# Patient Record
Sex: Female | Born: 1975 | Race: Black or African American | Hispanic: No | Marital: Single | Smoking: Never smoker
Health system: Southern US, Community
[De-identification: ages and names within clinical notes are randomized; demographics above are authoritative.]

## PROBLEM LIST (undated history)

## (undated) DIAGNOSIS — D649 Anemia, unspecified: Secondary | ICD-10-CM

## (undated) DIAGNOSIS — B009 Herpesviral infection, unspecified: Secondary | ICD-10-CM

## (undated) HISTORY — DX: Herpesviral infection, unspecified: B00.9

## (undated) HISTORY — DX: Anemia, unspecified: D64.9

---

## 1999-01-29 HISTORY — PX: ANTERIOR CRUCIATE LIGAMENT REPAIR: SHX115

## 2015-05-29 ENCOUNTER — Encounter: Payer: Self-pay | Admitting: Family Medicine

## 2015-05-29 ENCOUNTER — Ambulatory Visit (INDEPENDENT_AMBULATORY_CARE_PROVIDER_SITE_OTHER): Payer: BLUE CROSS/BLUE SHIELD | Admitting: Family Medicine

## 2015-05-29 VITALS — BP 123/83 | HR 94 | Temp 97.7°F | Ht 65.0 in | Wt 224.8 lb

## 2015-05-29 DIAGNOSIS — A6000 Herpesviral infection of urogenital system, unspecified: Secondary | ICD-10-CM

## 2015-05-29 DIAGNOSIS — M25473 Effusion, unspecified ankle: Secondary | ICD-10-CM | POA: Insufficient documentation

## 2015-05-29 DIAGNOSIS — Z0000 Encounter for general adult medical examination without abnormal findings: Secondary | ICD-10-CM | POA: Diagnosis not present

## 2015-05-29 NOTE — Progress Notes (Signed)
   HPI  Patient presents today to establish care and for annual physical exam with Pap smear.  Moved 1 year ago from [place] to be with family  Patient explains she has no concerns except for leg swelling.  It's been going on for years, symmetric, slightly uncomfortable in the day. She has tried elevation and TED hose with no improvement. She's not really interested in medications.  She has normal urination.  She's not sexually active, however she has genital HSV 1 He is not concerned about sexually transmitted infections today.  She's never had a abnormal Pap smear, and more than 3 years since her last Pap smear.   PMH: Smoking status noted Her past medical, surgical, social history ROS: Per HPI  Objective: BP 123/83 mmHg  Pulse 94  Temp(Src) 97.7 F (36.5 C) (Oral)  Ht 5\' 5"  (1.651 m)  Wt 224 lb 12.8 oz (101.969 kg)  BMI 37.41 kg/m2  LMP 05/13/2014 Gen: NAD, alert, cooperative with exam HEENT: NCAT, EOMI, PERRL, nares clear CV: RRR, good S1/S2, no murmur Resp: CTABL, no wheezes, non-labored Abd: SNTND, BS present, no guarding or organomegaly Ext: Bilateral nonpitting edema to the midcalf Neuro: Alert and oriented, strength 5/5 and sensation intact in bilateral lower extremities and bilateral upper extremities, patellar tendon reflexes difficult to elucidate  GU Normal bimanual exam Cervix nulliparous, minimal discharge Normal appearing female perineum with no lesions   Assessment and plan:  # Annual physical exam Normal exam, Pap smear pending Basic labs  # Ankle swelling No concern for DVT Offered 12.5 HCTZ, she would like to wait Compression stockings if needed Reassurance   Laroy Apple, MD [facility] 05/29/2015, 8:43 AM

## 2015-05-29 NOTE — Patient Instructions (Signed)
Great to meet you!  We will call with labs within 1 week, the pap takes 3-4 days to return  Lets plan to see you once a year, you will not be due for a pap for 3 years

## 2015-05-29 NOTE — Addendum Note (Signed)
Addended by: Nigel Berthold C on: 05/29/2015 08:56 AM   Modules accepted: Orders, SmartSet

## 2015-05-30 ENCOUNTER — Other Ambulatory Visit: Payer: Self-pay | Admitting: Family Medicine

## 2015-05-30 DIAGNOSIS — D473 Essential (hemorrhagic) thrombocythemia: Secondary | ICD-10-CM

## 2015-05-30 DIAGNOSIS — D75839 Thrombocytosis, unspecified: Secondary | ICD-10-CM | POA: Insufficient documentation

## 2015-05-30 LAB — CMP14+EGFR
ALK PHOS: 73 IU/L (ref 39–117)
ALT: 15 IU/L (ref 0–32)
AST: 13 IU/L (ref 0–40)
Albumin/Globulin Ratio: 1.2 (ref 1.2–2.2)
Albumin: 4 g/dL (ref 3.5–5.5)
BUN/Creatinine Ratio: 11 (ref 9–23)
BUN: 9 mg/dL (ref 6–20)
Bilirubin Total: 0.6 mg/dL (ref 0.0–1.2)
CO2: 20 mmol/L (ref 18–29)
CREATININE: 0.85 mg/dL (ref 0.57–1.00)
Calcium: 9.1 mg/dL (ref 8.7–10.2)
Chloride: 100 mmol/L (ref 96–106)
GFR calc Af Amer: 101 mL/min/{1.73_m2} (ref >59)
GFR calc non Af Amer: 88 mL/min/{1.73_m2} (ref >59)
GLOBULIN, TOTAL: 3.3 g/dL (ref 1.5–4.5)
GLUCOSE: 86 mg/dL (ref 65–99)
Potassium: 4.2 mmol/L (ref 3.5–5.2)
SODIUM: 136 mmol/L (ref 134–144)
Total Protein: 7.3 g/dL (ref 6.0–8.5)

## 2015-05-30 LAB — CBC WITH DIFFERENTIAL/PLATELET
BASOS ABS: 0 10*3/uL (ref 0.0–0.2)
Basos: 1 %
EOS (ABSOLUTE): 0 10*3/uL (ref 0.0–0.4)
EOS: 1 %
Hematocrit: 35.8 % (ref 34.0–46.6)
Hemoglobin: 11.5 g/dL (ref 11.1–15.9)
IMMATURE GRANULOCYTES: 0 %
Immature Grans (Abs): 0 10*3/uL (ref 0.0–0.1)
Lymphocytes Absolute: 1.8 10*3/uL (ref 0.7–3.1)
Lymphs: 37 %
MCH: 27.8 pg (ref 26.6–33.0)
MCHC: 32.1 g/dL (ref 31.5–35.7)
MCV: 87 fL (ref 79–97)
MONOS ABS: 0.3 10*3/uL (ref 0.1–0.9)
Monocytes: 6 %
NEUTROS PCT: 55 %
Neutrophils Absolute: 2.6 10*3/uL (ref 1.4–7.0)
PLATELETS: 461 10*3/uL — AB (ref 150–379)
RBC: 4.13 x10E6/uL (ref 3.77–5.28)
RDW: 13.7 % (ref 12.3–15.4)
WBC: 4.7 10*3/uL (ref 3.4–10.8)

## 2015-05-30 LAB — LIPID PANEL
CHOLESTEROL TOTAL: 142 mg/dL (ref 100–199)
Chol/HDL Ratio: 2.7 ratio units (ref 0.0–4.4)
HDL: 53 mg/dL (ref >39)
LDL Calculated: 70 mg/dL (ref 0–99)
TRIGLYCERIDES: 95 mg/dL (ref 0–149)
VLDL Cholesterol Cal: 19 mg/dL (ref 5–40)

## 2015-05-30 LAB — TSH: TSH: 1.92 u[IU]/mL (ref 0.450–4.500)

## 2015-05-30 NOTE — Telephone Encounter (Signed)
Follow-up labs for elevated platelets.  Repeat CBC, blood smear, iron panel. Young healthy-appearing female, likely spurious vs not clinically important result.   Laroy Apple, MD [facility] 05/30/2015, 7:43 AM

## 2015-05-31 LAB — PAP IG W/ RFLX HPV ASCU: PAP Smear Comment: 0

## 2015-07-03 ENCOUNTER — Ambulatory Visit (INDEPENDENT_AMBULATORY_CARE_PROVIDER_SITE_OTHER): Payer: BLUE CROSS/BLUE SHIELD | Admitting: Family Medicine

## 2015-07-03 ENCOUNTER — Encounter: Payer: Self-pay | Admitting: Family Medicine

## 2015-07-03 VITALS — BP 107/75 | HR 83 | Temp 98.8°F | Ht 65.0 in | Wt 225.4 lb

## 2015-07-03 DIAGNOSIS — H66002 Acute suppurative otitis media without spontaneous rupture of ear drum, left ear: Secondary | ICD-10-CM | POA: Diagnosis not present

## 2015-07-03 MED ORDER — AZITHROMYCIN 250 MG PO TABS
ORAL_TABLET | ORAL | Status: DC
Start: 2015-07-03 — End: 2016-10-21

## 2015-07-03 NOTE — Progress Notes (Signed)
BP 107/75 mmHg  Pulse 83  Temp(Src) 98.8 F (37.1 C) (Oral)  Ht 5\' 5"  (1.651 m)  Wt 225 lb 6.4 oz (102.241 kg)  BMI 37.51 kg/m2  LMP 06/12/2015 (Approximate)   Subjective:    Patient ID: Janice Galvan, female    DOB: [birth year], 40 y.o.   MRN: [id number]  HPI: Janice Galvan is a 40 y.o. female presenting on 07/03/2015 for Left ear pain   HPI Left ear pain Patient has been having left ear pain is been going on for the past couple days. She denies any fevers or chills. She denies any changes in hearing. She denies any nasal discharge or postnasal drainage or sinus congestion or pressure or cough. She says just her ear has been bothering her.  Relevant past medical, surgical, family and social history reviewed and updated as indicated. Interim medical history since our last visit reviewed. Allergies and medications reviewed and updated.  Review of Systems  Constitutional: Negative for fever and chills.  HENT: Positive for ear pain. Negative for congestion, ear discharge, hearing loss, rhinorrhea and sinus pressure.   Eyes: Negative for redness and visual disturbance.  Respiratory: Negative for chest tightness and shortness of breath.   Cardiovascular: Negative for chest pain and leg swelling.  Genitourinary: Negative for dysuria and difficulty urinating.  Musculoskeletal: Negative for back pain and gait problem.  Skin: Negative for rash.  Neurological: Negative for light-headedness and headaches.  Psychiatric/Behavioral: Negative for behavioral problems and agitation.  All other systems reviewed and are negative.   Per HPI unless specifically indicated above     Medication List       This list is accurate as of: 07/03/15  8:37 AM.  Always use your most recent med list.               azithromycin 250 MG tablet  Commonly known as:  ZITHROMAX  Take 2 the first day and then one each day after.           Objective:    BP 107/75 mmHg  Pulse 83  Temp(Src)  98.8 F (37.1 C) (Oral)  Ht 5\' 5"  (1.651 m)  Wt 225 lb 6.4 oz (102.241 kg)  BMI 37.51 kg/m2  LMP 06/12/2015 (Approximate)  Wt Readings from Last 3 Encounters:  07/03/15 225 lb 6.4 oz (102.241 kg)  05/29/15 224 lb 12.8 oz (101.969 kg)    Physical Exam  Constitutional: She is oriented to person, place, and time. She appears well-developed and well-nourished. No distress.  HENT:  Right Ear: Tympanic membrane, external ear and ear canal normal.  Left Ear: External ear and ear canal normal. Tympanic membrane is injected, erythematous and bulging. Tympanic membrane is not perforated. A middle ear effusion is present.  Nose: No mucosal edema or rhinorrhea. No epistaxis. Right sinus exhibits no maxillary sinus tenderness and no frontal sinus tenderness. Left sinus exhibits no maxillary sinus tenderness and no frontal sinus tenderness.  Mouth/Throat: Uvula is midline and mucous membranes are normal. No oropharyngeal exudate, posterior oropharyngeal edema, posterior oropharyngeal erythema or tonsillar abscesses.  Eyes: Conjunctivae and EOM are normal.  Neck: Neck supple. No thyromegaly present.  Cardiovascular: Normal rate, regular rhythm, normal heart sounds and intact distal pulses.   No murmur heard. Pulmonary/Chest: Effort normal and breath sounds normal. No respiratory distress. She has no wheezes.  Musculoskeletal: Normal range of motion. She exhibits no edema or tenderness.  Lymphadenopathy:    She has no cervical adenopathy.  Neurological: She is alert and  oriented to person, place, and time. Coordination normal.  Skin: Skin is warm and dry. No rash noted. She is not diaphoretic.  Psychiatric: She has a normal mood and affect. Her behavior is normal.  Vitals reviewed.     Assessment & Plan:   Problem List Items Addressed This Visit    None    Visit Diagnoses    Acute suppurative otitis media of left ear without spontaneous rupture of tympanic membrane, recurrence not specified    -   Primary    Relevant Medications    azithromycin (ZITHROMAX) 250 MG tablet        Follow up plan: Return if symptoms worsen or fail to improve.  Counseling provided for all of the vaccine components No orders of the defined types were placed in this encounter.    Caryl Pina, MD [facility] 07/03/2015, 8:37 AM

## 2015-08-21 ENCOUNTER — Ambulatory Visit (INDEPENDENT_AMBULATORY_CARE_PROVIDER_SITE_OTHER): Payer: BLUE CROSS/BLUE SHIELD | Admitting: Otolaryngology

## 2015-08-21 DIAGNOSIS — H93293 Other abnormal auditory perceptions, bilateral: Secondary | ICD-10-CM

## 2015-08-21 DIAGNOSIS — H6983 Other specified disorders of Eustachian tube, bilateral: Secondary | ICD-10-CM

## 2016-10-21 ENCOUNTER — Encounter: Payer: Self-pay | Admitting: Family Medicine

## 2016-10-21 ENCOUNTER — Ambulatory Visit (INDEPENDENT_AMBULATORY_CARE_PROVIDER_SITE_OTHER): Payer: BLUE CROSS/BLUE SHIELD | Admitting: Family Medicine

## 2016-10-21 VITALS — BP 110/67 | HR 77 | Temp 97.5°F | Ht 65.0 in | Wt 233.2 lb

## 2016-10-21 DIAGNOSIS — Z0000 Encounter for general adult medical examination without abnormal findings: Secondary | ICD-10-CM | POA: Diagnosis not present

## 2016-10-21 NOTE — Progress Notes (Signed)
   HPI  Patient presents today here for annual physical exam.  Patient feels well and has no complaints. She had a normal Pap smear last year.  She is not exercising except for walking occasionally. She is also not watching her diet and states that she does like fast food and 8 frequently, she also drinks sugar sweetened beverages.  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  PMH: Smoking status noted ROS: Per HPI  Objective: BP 110/67   Pulse 77   Temp (!) 97.5 F (36.4 C) (Oral)   Ht _0  (1.651 m)   Wt 233 lb 3.2 oz (105.8 kg)   LMP 09/26/2016   BMI 38.81 kg/m  Gen: NAD, alert, cooperative with exam HEENT: NCAT, oropharynx moist and clear CV: RRR, good S1/S2, no murmur Resp: CTABL, no wheezes, non-labored Abd: SNTND, BS present, no guarding or organomegaly Ext: Nonpitting edema bilaterally, symmetric Neuro: Alert and oriented, No gross deficits  Assessment and plan:  # No physical exam Normal exam except for weight, discussed healthy lifestyle choices. Labs today, previous labs are very good except for slightly elevated platelets Repeat CBC and other labs. Discussed compression stockings if she would like them for comfort   Orders Placed This Encounter  Procedures  . CBC with Differential/Platelet  . CMP14+EGFR  . Lipid panel  . TSH     Laroy Apple, MD [facility] 10/21/2016, 10:34 AM

## 2016-10-21 NOTE — Patient Instructions (Signed)
Great to see you!  Come back in 1 year unless you need [place] sooner.   I am watching your elevated platelets, if we need further workup we will let you know  Health Maintenance, Female Adopting a healthy lifestyle and getting preventive care can go a long way to promote health and wellness. Talk with your health care provider about what schedule of regular examinations is right for you. This is a good chance for you to check in with your provider about disease prevention and staying healthy. In between checkups, there are plenty of things you can do on your own. Experts have done a lot of research about which lifestyle changes and preventive measures are most likely to keep you healthy. Ask your health care provider for more information. Weight and diet Eat a healthy diet  Be sure to include plenty of vegetables, fruits, low-fat dairy products, and lean protein.  Do not eat a lot of foods high in solid fats, added sugars, or salt.  Get regular exercise. This is one of the most important things you can do for your health. ? Most adults should exercise for at least 150 minutes each week. The exercise should increase your heart rate and make you sweat (moderate-intensity exercise). ? Most adults should also do strengthening exercises at least twice a week. This is in addition to the moderate-intensity exercise.  Maintain a healthy weight  Body mass index (BMI) is a measurement that can be used to identify possible weight problems. It estimates body fat based on height and weight. Your health care provider can help determine your BMI and help you achieve or maintain a healthy weight.  For females 79 years of age and older: ? A BMI below 18.5 is considered underweight. ? A BMI of 18.5 to 24.9 is normal. ? A BMI of 25 to 29.9 is considered overweight. ? A BMI of 30 and above is considered obese.  Watch levels of cholesterol and blood lipids  You should start having your blood tested for lipids  and cholesterol at 39 years of age, then have this test every 39 years.  You may need to have your cholesterol levels checked more often if: ? Your lipid or cholesterol levels are high. ? You are older than 39 years of age. ? You are at high risk for heart disease.  Cancer screening Lung Cancer  Lung cancer screening is recommended for adults 2-99 years old who are at high risk for lung cancer because of a history of smoking.  A yearly low-dose CT scan of the lungs is recommended for people who: ? Currently smoke. ? Have quit within the past 15 years. ? Have at least a 30-pack-year history of smoking. A pack year is smoking an average of one pack of cigarettes a day for 1 year.  Yearly screening should continue until it has been 15 years since you quit.  Yearly screening should stop if you develop a health problem that would prevent you from having lung cancer treatment.  Breast Cancer  Practice breast self-awareness. This means understanding how your breasts normally appear and feel.  It also means doing regular breast self-exams. Let your health care provider know about any changes, no matter how small.  If you are in your 20s or 30s, you should have a clinical breast exam (CBE) by a health care provider every 1-3 years as part of a regular health exam.  If you are 15 or older, have a CBE every year. Also  consider having a breast X-ray (mammogram) every year.  If you have a family history of breast cancer, talk to your health care provider about genetic screening.  If you are at high risk for breast cancer, talk to your health care provider about having an MRI and a mammogram every year.  Breast cancer gene (BRCA) assessment is recommended for women who have family members with BRCA-related cancers. BRCA-related cancers include: ? Breast. ? Ovarian. ? Tubal. ? Peritoneal cancers.  Results of the assessment will determine the need for genetic counseling and BRCA1 and BRCA2  testing.  Cervical Cancer Your health care provider may recommend that you be screened regularly for cancer of the pelvic organs (ovaries, uterus, and vagina). This screening involves a pelvic examination, including checking for microscopic changes to the surface of your cervix (Pap test). You may be encouraged to have this screening done every 3 years, beginning at age 39.  For women ages 60-65, health care providers may recommend pelvic exams and Pap testing every 3 years, or they may recommend the Pap and pelvic exam, combined with testing for human papilloma virus (HPV), every 5 years. Some types of HPV increase your risk of cervical cancer. Testing for HPV may also be done on women of any age with unclear Pap test results.  Other health care providers may not recommend any screening for nonpregnant women who are considered low risk for pelvic cancer and who do not have symptoms. Ask your health care provider if a screening pelvic exam is right for you.  If you have had past treatment for cervical cancer or a condition that could lead to cancer, you need Pap tests and screening for cancer for at least 20 years after your treatment. If Pap tests have been discontinued, your risk factors (such as having a new sexual partner) need to be reassessed to determine if screening should resume. Some women have medical problems that increase the chance of getting cervical cancer. In these cases, your health care provider may recommend more frequent screening and Pap tests.  Colorectal Cancer  This type of cancer can be detected and often prevented.  Routine colorectal cancer screening usually begins at 39 years of age and continues through 39 years of age.  Your health care provider may recommend screening at an earlier age if you have risk factors for colon cancer.  Your health care provider may also recommend using home test kits to check for hidden blood in the stool.  A small camera at the end of a  tube can be used to examine your colon directly (sigmoidoscopy or colonoscopy). This is done to check for the earliest forms of colorectal cancer.  Routine screening usually begins at age 39.  Direct examination of the colon should be repeated every 5-10 years through 39 years of age. However, you may need to be screened more often if early forms of precancerous polyps or small growths are found.  Skin Cancer  Check your skin from head to toe regularly.  Tell your health care provider about any new moles or changes in moles, especially if there is a change in a mole's shape or color.  Also tell your health care provider if you have a mole that is larger than the size of a pencil eraser.  Always use sunscreen. Apply sunscreen liberally and repeatedly throughout the day.  Protect yourself by wearing long sleeves, pants, a wide-brimmed hat, and sunglasses whenever you are outside.  Heart disease, diabetes, and high blood  pressure  High blood pressure causes heart disease and increases the risk of stroke. High blood pressure is more likely to develop in: ? People who have blood pressure in the high end of the normal range (130-139/85-89 mm Hg). ? People who are overweight or obese. ? People who are African American.  If you are 34-17 years of age, have your blood pressure checked every 3-5 years. If you are 79 years of age or older, have your blood pressure checked every year. You should have your blood pressure measured twice-once when you are at a hospital or clinic, and once when you are not at a hospital or clinic. Record the average of the two measurements. To check your blood pressure when you are not at a hospital or clinic, you can use: ? An automated blood pressure machine at a pharmacy. ? A home blood pressure monitor.  If you are between 15 years and 57 years old, ask your health care provider if you should take aspirin to prevent strokes.  Have regular diabetes screenings. This  involves taking a blood sample to check your fasting blood sugar level. ? If you are at a normal weight and have a low risk for diabetes, have this test once every three years after 39 years of age. ? If you are overweight and have a high risk for diabetes, consider being tested at a younger age or more often. Preventing infection Hepatitis B  If you have a higher risk for hepatitis B, you should be screened for this virus. You are considered at high risk for hepatitis B if: ? You were born in a country where hepatitis B is common. Ask your health care provider which countries are considered high risk. ? Your parents were born in a high-risk country, and you have not been immunized against hepatitis B (hepatitis B vaccine). ? You have HIV or AIDS. ? You use needles to inject street drugs. ? You live with someone who has hepatitis B. ? You have had sex with someone who has hepatitis B. ? You get hemodialysis treatment. ? You take certain medicines for conditions, including cancer, organ transplantation, and autoimmune conditions.  Hepatitis C  Blood testing is recommended for: ? Everyone born from [birth year] through 1963. ? Anyone with known risk factors for hepatitis C.  Sexually transmitted infections (STIs)  You should be screened for sexually transmitted infections (STIs) including gonorrhea and chlamydia if: ? You are sexually active and are younger than 39 years of age. ? You are older than 39 years of age and your health care provider tells you that you are at risk for this type of infection. ? Your sexual activity has changed since you were last screened and you are at an increased risk for chlamydia or gonorrhea. Ask your health care provider if you are at risk.  If you do not have HIV, but are at risk, it may be recommended that you take a prescription medicine daily to prevent HIV infection. This is called pre-exposure prophylaxis (PrEP). You are considered at risk if: ? You are  sexually active and do not regularly use condoms or know the HIV status of your partner(s). ? You take drugs by injection. ? You are sexually active with a partner who has HIV.  Talk with your health care provider about whether you are at high risk of being infected with HIV. If you choose to begin PrEP, you should first be tested for HIV. You should then be tested every  3 months for as long as you are taking PrEP. Pregnancy  If you are premenopausal and you may become pregnant, ask your health care provider about preconception counseling.  If you may become pregnant, take 400 to 800 micrograms (mcg) of folic acid every day.  If you want to prevent pregnancy, talk to your health care provider about birth control (contraception). Osteoporosis and menopause  Osteoporosis is a disease in which the bones lose minerals and strength with aging. This can result in serious bone fractures. Your risk for osteoporosis can be identified using a bone density scan.  If you are 16 years of age or older, or if you are at risk for osteoporosis and fractures, ask your health care provider if you should be screened.  Ask your health care provider whether you should take a calcium or vitamin D supplement to lower your risk for osteoporosis.  Menopause may have certain physical symptoms and risks.  Hormone replacement therapy may reduce some of these symptoms and risks. Talk to your health care provider about whether hormone replacement therapy is right for you. Follow these instructions at home:  Schedule regular health, dental, and eye exams.  Stay current with your immunizations.  Do not use any tobacco products including cigarettes, chewing tobacco, or electronic cigarettes.  If you are pregnant, do not drink alcohol.  If you are breastfeeding, limit how much and how often you drink alcohol.  Limit alcohol intake to no more than 1 drink per day for nonpregnant women. One drink equals 12 ounces of  beer, 5 ounces of wine, or 1 ounces of hard liquor.  Do not use street drugs.  Do not share needles.  Ask your health care provider for help if you need support or information about quitting drugs.  Tell your health care provider if you often feel depressed.  Tell your health care provider if you have ever been abused or do not feel safe at home. This information is not intended to replace advice given to you by your health care provider. Make sure you discuss any questions you have with your health care provider. Document Released: 07/30/2010 Document Revised: 06/22/2015 Document Reviewed: 10/18/2014 Elsevier Interactive Patient Education  [employer].

## 2016-10-22 ENCOUNTER — Telehealth: Payer: Self-pay | Admitting: Family Medicine

## 2016-10-22 DIAGNOSIS — D509 Iron deficiency anemia, unspecified: Secondary | ICD-10-CM

## 2016-10-22 LAB — CMP14+EGFR
A/G RATIO: 1.5 (ref 1.2–2.2)
ALT: 11 IU/L (ref 0–32)
AST: 15 IU/L (ref 0–40)
Albumin: 4 g/dL (ref 3.5–5.5)
Alkaline Phosphatase: 71 IU/L (ref 39–117)
BILIRUBIN TOTAL: 0.4 mg/dL (ref 0.0–1.2)
BUN/Creatinine Ratio: 9 (ref 9–23)
BUN: 8 mg/dL (ref 6–20)
CHLORIDE: 103 mmol/L (ref 96–106)
CO2: 18 mmol/L — ABNORMAL LOW (ref 20–29)
Calcium: 8.9 mg/dL (ref 8.7–10.2)
Creatinine, Ser: 0.89 mg/dL (ref 0.57–1.00)
GFR calc Af Amer: 94 mL/min/{1.73_m2} (ref >59)
GFR calc non Af Amer: 82 mL/min/{1.73_m2} (ref >59)
Globulin, Total: 2.7 g/dL (ref 1.5–4.5)
Glucose: 84 mg/dL (ref 65–99)
POTASSIUM: 4.4 mmol/L (ref 3.5–5.2)
Sodium: 137 mmol/L (ref 134–144)
Total Protein: 6.7 g/dL (ref 6.0–8.5)

## 2016-10-22 LAB — CBC WITH DIFFERENTIAL/PLATELET
BASOS: 1 %
Basophils Absolute: 0 10*3/uL (ref 0.0–0.2)
EOS (ABSOLUTE): 0 10*3/uL (ref 0.0–0.4)
Eos: 1 %
HEMOGLOBIN: 7.5 g/dL — AB (ref 11.1–15.9)
Hematocrit: 26.6 % — ABNORMAL LOW (ref 34.0–46.6)
IMMATURE GRANS (ABS): 0 10*3/uL (ref 0.0–0.1)
Immature Granulocytes: 0 %
LYMPHS: 34 %
Lymphocytes Absolute: 1.7 10*3/uL (ref 0.7–3.1)
MCH: 20 pg — AB (ref 26.6–33.0)
MCHC: 28.2 g/dL — ABNORMAL LOW (ref 31.5–35.7)
MCV: 71 fL — AB (ref 79–97)
MONOCYTES: 6 %
Monocytes Absolute: 0.3 10*3/uL (ref 0.1–0.9)
NEUTROS ABS: 2.9 10*3/uL (ref 1.4–7.0)
Neutrophils: 58 %
Platelets: 499 10*3/uL — ABNORMAL HIGH (ref 150–379)
RBC: 3.75 x10E6/uL — ABNORMAL LOW (ref 3.77–5.28)
RDW: 17.2 % — ABNORMAL HIGH (ref 12.3–15.4)
WBC: 5 10*3/uL (ref 3.4–10.8)

## 2016-10-22 LAB — LIPID PANEL
Chol/HDL Ratio: 2.7 ratio (ref 0.0–4.4)
Cholesterol, Total: 123 mg/dL (ref 100–199)
HDL: 45 mg/dL (ref >39)
LDL Calculated: 68 mg/dL (ref 0–99)
TRIGLYCERIDES: 51 mg/dL (ref 0–149)
VLDL Cholesterol Cal: 10 mg/dL (ref 5–40)

## 2016-10-22 LAB — TSH: TSH: 1.92 u[IU]/mL (ref 0.450–4.500)

## 2016-10-22 NOTE — Telephone Encounter (Signed)
Called and discussed new microcytic anemia.   Denies any source of bleeding, she states she has normal regular periods. She has been "borderline anemic" in the past. She does not have any familial anemias.  We also discussed her mild decrease of CO2, considering no symptoms and normal health until believe this is clinically relevant at this time. However I did recommend repeat labs for her anemia and referral to hematology considering also elevated platelets at 499.  Laroy Apple, MD [facility] 10/22/2016, 12:30 PM

## 2016-10-23 ENCOUNTER — Other Ambulatory Visit: Payer: Self-pay

## 2016-10-23 ENCOUNTER — Other Ambulatory Visit: Payer: BLUE CROSS/BLUE SHIELD

## 2016-10-23 DIAGNOSIS — D509 Iron deficiency anemia, unspecified: Secondary | ICD-10-CM | POA: Diagnosis not present

## 2016-10-25 ENCOUNTER — Other Ambulatory Visit: Payer: Self-pay | Admitting: Family Medicine

## 2016-10-25 ENCOUNTER — Telehealth: Payer: Self-pay | Admitting: Family Medicine

## 2016-10-25 LAB — ANEMIA PROFILE B
BASOS ABS: 0 10*3/uL (ref 0.0–0.2)
BASOS: 1 %
EOS (ABSOLUTE): 0.1 10*3/uL (ref 0.0–0.4)
Eos: 1 %
FOLATE: 4.5 ng/mL (ref >3.0)
Ferritin: 5 ng/mL — ABNORMAL LOW (ref 15–150)
HEMATOCRIT: 27.5 % — AB (ref 34.0–46.6)
Hemoglobin: 7.9 g/dL — CL (ref 11.1–15.9)
IMMATURE GRANS (ABS): 0 10*3/uL (ref 0.0–0.1)
IRON: 25 ug/dL — AB (ref 27–159)
Immature Granulocytes: 0 %
Iron Saturation: 6 % — CL (ref 15–55)
LYMPHS: 35 %
Lymphocytes Absolute: 1.9 10*3/uL (ref 0.7–3.1)
MCH: 20.9 pg — ABNORMAL LOW (ref 26.6–33.0)
MCHC: 28.7 g/dL — ABNORMAL LOW (ref 31.5–35.7)
MCV: 73 fL — ABNORMAL LOW (ref 79–97)
MONOCYTES: 7 %
Monocytes Absolute: 0.4 10*3/uL (ref 0.1–0.9)
NEUTROS ABS: 3.1 10*3/uL (ref 1.4–7.0)
Neutrophils: 56 %
Platelets: 595 10*3/uL — ABNORMAL HIGH (ref 150–379)
RBC: 3.78 x10E6/uL (ref 3.77–5.28)
RDW: 17.2 % — ABNORMAL HIGH (ref 12.3–15.4)
RETIC CT PCT: 2.3 % (ref 0.6–2.6)
Total Iron Binding Capacity: 445 ug/dL (ref 250–450)
UIBC: 420 ug/dL (ref 131–425)
VITAMIN B 12: 387 pg/mL (ref 232–1245)
WBC: 5.5 10*3/uL (ref 3.4–10.8)

## 2016-10-25 MED ORDER — FERROUS SULFATE 325 (65 FE) MG PO TABS: 325.0000 mg | ORAL_TABLET | Freq: Two times a day (BID) | ORAL | 3 refills | Status: DC

## 2016-10-25 NOTE — Telephone Encounter (Signed)
Spoke with patient about results.

## 2016-10-28 ENCOUNTER — Encounter (HOSPITAL_COMMUNITY): Payer: BLUE CROSS/BLUE SHIELD | Attending: Oncology | Admitting: Oncology

## 2016-10-28 ENCOUNTER — Encounter (HOSPITAL_COMMUNITY): Payer: Self-pay | Admitting: Oncology

## 2016-10-28 VITALS — BP 121/64 | HR 81 | Resp 16 | Ht 65.0 in | Wt 226.4 lb

## 2016-10-28 DIAGNOSIS — D473 Essential (hemorrhagic) thrombocythemia: Secondary | ICD-10-CM

## 2016-10-28 DIAGNOSIS — D509 Iron deficiency anemia, unspecified: Secondary | ICD-10-CM

## 2016-10-28 DIAGNOSIS — D75839 Thrombocytosis, unspecified: Secondary | ICD-10-CM

## 2016-10-28 DIAGNOSIS — D500 Iron deficiency anemia secondary to blood loss (chronic): Secondary | ICD-10-CM

## 2016-10-28 NOTE — Patient Instructions (Signed)
[place] at [facility] Discharge Instructions  RECOMMENDATIONS MADE BY THE CONSULTANT AND ANY TEST RESULTS WILL BE SENT TO YOUR REFERRING PHYSICIAN.  You were seen today by Dr. Oliva Bustard. Feraheme to be given before next visit, to be scheduled once insurance is checked. Return in 3 weeks for labs and follow up.  Thank you for choosing [facility] at [facility] to provide your oncology and hematology care.  To afford each patient quality time with our provider, please arrive at least 15 minutes before your scheduled appointment time.    If you have a lab appointment with the [facility] please come in thru the  Main Entrance and check in at the main information desk  You need to re-schedule your appointment should you arrive 10 or more minutes late.  We strive to give you quality time with our providers, and arriving late affects you and other patients whose appointments are after yours.  Also, if you no show three or more times for appointments you may be dismissed from the clinic at the providers discretion.     Again, thank you for choosing [facility]  Our hope is that these requests will decrease the amount of time that you wait before being seen by our physicians.       _____________________________________________________________  Should you have questions after your visit to [facility], please contact our office at [phone] between the hours of 8:30 a.m. and 4:30 p.m.  Voicemails left after 4:30 p.m. will not be returned until the following business day.  For prescription refill requests, have your pharmacy contact our office.       Resources For Cancer Patients and their Caregivers ? American Cancer Society: Can assist with transportation, wigs, general needs, runs Look Good Feel Better.        820-733-6336 ? Cancer Care: Provides financial assistance, online support groups, medication/co-pay  assistance.  1-800-813-HOPE 507-136-8460) ? [place] Assists [place] cancer patients and their families through emotional , educational and financial support.  443 284 8697 ? [place] DSS Where to apply for food stamps, Medicaid and utility assistance. 256-453-6285 ? [facility]: Transportation to medical appointments. 872-226-8412 ? Social Security Administration: May apply for disability if have a Stage IV cancer. 671-742-3298 319-819-4753 ? [employer], Disability and Transit Services: Assists with nutrition, care and transit needs. [place] Support Programs: @10RELATIVEDAYS @ > Cancer Support Group  2nd Tuesday of the month 1pm-2pm, Journey Room  > Creative Journey  3rd Tuesday of the month 1130am-1pm, Journey Room  > Look Good Feel Better  1st Wednesday of the month 10am-12 noon, Journey Room (Call [place] to register [phone])

## 2016-10-28 NOTE — Progress Notes (Signed)
Janice Galvan @ [facility] [phone]  Fax:(336) [place]  Janice Galvan OB: 1976-01-03  MR#: [id number]  XNT#:[id number]  Patient Care Team: Timmothy Euler, MD as PCP - General (Family Medicine)  CHIEF COMPLAINT:  Chief Complaint  Patient presents with  . New Patient (Initial Visit)    VISIT DIAGNOSIS:  No diagnosis found.  Iron deficiency anemia  No flowsheet data found.  INTERVAL HISTORY:   41 year old lady was referred to me for further evaluation regarding anemia.Marland Kitchen 5 days ago patient had a hemoglobin of 7.9.  White count of 5.5.  Hematocrit to 27.5.  An platelet count of 5 and 95,000 Patient denies any excessive menstrual cycle.  Denies any hematemesis melena.  Never had colonoscopy done. Patient is also concerned about slightly elevated platelet count. Fatigue appears to be main issue.  Patient has been taking some iron tablet but cannot take more than a tablet occasionally has constipation that. Here for further follow-up and treatment consideration  REVIEW OF SYSTEMS:   GENERAL:  Feels good.  Active.  No fevers, sweats or weight loss.  Fatigue appears to be major problem. 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 PERFORMANCE STATUS (ECOG):  0 HEENT:  No visual changes, runny nose, sore throat, mouth sores or tenderness. Lungs: No shortness of breath or cough.  No hemoptysis. Cardiac:  No chest pain, palpitations, orthopnea, or PND. GI:  No nausea, vomiting, diarrhea, constipation, melena or hematochezia. GU:  No urgency, frequency, dysuria, or hematuria. Musculoskeletal:  No back pain.  No joint pain.  No muscle tenderness. Extremities:  No pain or swelling. Skin:  No rashes or skin changes. Neuro:  No headache, numbness or weakness, balance or coordination issues. Endocrine:  No diabetes, thyroid issues, hot flashes or night sweats. Psych:  No mood changes, depression or anxiety. Pain:  No focal pain. Review of systems:  All other  systems reviewed and found to be negative.  As per HPI. Otherwise, a complete review of systems is negatve.  PAST MEDICAL HISTORY: No significant past medical history PAST SURGICAL HISTORY: Past Surgical History:  Procedure Laterality Date  . ANTERIOR CRUCIATE LIGAMENT REPAIR Left 2001    FAMILY HISTORY Family History  Problem Relation Age of Onset  . Cancer Father        lung  . Schizophrenia Brother   . Diabetes Brother   . Hypertension Brother     GYNECOLOGIC HISTORY:  Last menstrual cycle was a week ago.  Denies any history of excessive menstrual bleeding    ADVANCED DIRECTIVES:  Patient does  NOT  have advance healthcare directive, Patient   does not desire to make any changes  HEALTH MAINTENANCE: Social History  Substance Use Topics  . Smoking status: Never Smoker  . Smokeless tobacco: Never Used  . Alcohol use Yes     Colonoscopy:  PAP:  Bone density:  Lipid panel:  No Known Allergies  Current Outpatient Prescriptions  Medication Sig Dispense Refill  . ferrous sulfate 325 (65 FE) MG tablet Take 1 tablet (325 mg total) by mouth 2 (two) times daily with a meal. 60 tablet 3   No current facility-administered medications for this visit.     OBJECTIVE: PHYSICAL EXAM: GENERAL:  Feels good.  Active.  No fevers, sweats or weight loss. PERFORMANCE STATUS (ECOG):  0 HEENT:  No visual changes, runny nose, sore throat, mouth sores or tenderness. Conjunctiva  ;PALLOR Lungs: No shortness of breath or cough.  No hemoptysis. Cardiac:  No chest  pain, palpitations, orthopnea, or PND. GI:  No nausea, vomiting, diarrhea, constipation, melena or hematochezia. GU:  No urgency, frequency, dysuria, or hematuria. Musculoskeletal:  No back pain.  No joint pain.  No muscle tenderness. Extremities:  No pain or swelling. Skin:  No rashes or skin changes. Neuro:  No headache, numbness or weakness, balance or coordination issues. Endocrine:  No diabetes, thyroid issues, hot  flashes or night sweats. Psych:  No mood changes, depression or anxiety. Pain:  No focal pain. Review of systems:  All other systems reviewed and found to be negative.  Vitals:   10/28/16 0938  BP: 121/64  Pulse: 81  Resp: 16  SpO2: 100%     Body mass index is 37.67 kg/m.    ECOG FS:0 - Asymptomatic  LAB RESULTS:  No visits with results within 5 Day(s) from this visit.  Latest known visit with results is:  Appointment on 10/23/2016  Component Date Value Ref Range Status  . Total Iron Binding Capacity 10/23/2016 445  250 - 450 ug/dL Final  . UIBC 10/23/2016 420  131 - 425 ug/dL Final  . Iron 10/23/2016 25* 27 - 159 ug/dL Final  . Iron Saturation 10/23/2016 6* 15 - 55 % Final  . Ferritin 10/23/2016 5* 15 - 150 ng/mL Final  . Vitamin B-12 10/23/2016 387  232 - 1,245 pg/mL Final  . Folate 10/23/2016 4.5  >3.0 ng/mL Final   Comment: A serum folate concentration of less than 3.1 ng/mL is considered to represent clinical deficiency.   . WBC 10/23/2016 5.5  3.4 - 10.8 x10E3/uL Final  . RBC 10/23/2016 3.78  3.77 - 5.28 x10E6/uL Final  . Hemoglobin 10/23/2016 7.9* 11.1 - 15.9 g/dL Final                     Client Requested Flag  . Hematocrit 10/23/2016 27.5* 34.0 - 46.6 % Final  . MCV 10/23/2016 73* 79 - 97 fL Final  . MCH 10/23/2016 20.9* 26.6 - 33.0 pg Final  . MCHC 10/23/2016 28.7* 31.5 - 35.7 g/dL Final  . RDW 10/23/2016 17.2* 12.3 - 15.4 % Final  . Platelets 10/23/2016 595* 150 - 379 x10E3/uL Final  . Neutrophils 10/23/2016 56  Not Estab. % Final  . Lymphs 10/23/2016 35  Not Estab. % Final  . Monocytes 10/23/2016 7  Not Estab. % Final  . Eos 10/23/2016 1  Not Estab. % Final  . Basos 10/23/2016 1  Not Estab. % Final  . Neutrophils Absolute 10/23/2016 3.1  1.4 - 7.0 x10E3/uL Final  . Lymphocytes Absolute 10/23/2016 1.9  0.7 - 3.1 x10E3/uL Final  . Monocytes Absolute 10/23/2016 0.4  0.1 - 0.9 x10E3/uL Final  . EOS (ABSOLUTE) 10/23/2016 0.1  0.0 - 0.4 x10E3/uL Final  .  Basophils Absolute 10/23/2016 0.0  0.0 - 0.2 x10E3/uL Final  . Immature Granulocytes 10/23/2016 0  Not Estab. % Final  . Immature Grans (Abs) 10/23/2016 0.0  0.0 - 0.1 x10E3/uL Final  . Retic Ct Pct 10/23/2016 2.3  0.6 - 2.6 % Final     STUDIES: All lab data has been reviewed from outside facility ASSESSMENT:  Iron deficiency anemia  Elevated platelet   is most likely reactive due to iron deficiency anemia  Rose for Fox deficiency anemia is not known at this point in time.  Patient is Jehovah's Witness PLAN:   Stool for occult blood  X3  Advise GYN evaluation if needed Possibility of upper and lower endoscopy if there is  no other cause of bleeding is found and if stool for occult blood is positive  Patient is symptomatic due to anemia and oral iron may take much longer to replace severe iron deficiency patient may benefit from intravenous iron.  Pros and cons of such approach has been discussed with the patient and she is in agreement with the  Reevaluate patient with CBC iron iron-binding capacity and ferritin in 4-6 weeks Schedule intravenous iron next week Patient expressed understanding and was in agreement with this plan. She also understands that She can call clinic at any time with any questions, concerns, or complaints.    Cancer Staging No matching staging information was found for the patient.  Forest Gleason, MD   10/28/2016 12:08 PM

## 2016-10-30 ENCOUNTER — Encounter (HOSPITAL_COMMUNITY): Payer: Self-pay

## 2016-10-30 ENCOUNTER — Encounter (HOSPITAL_BASED_OUTPATIENT_CLINIC_OR_DEPARTMENT_OTHER): Payer: BLUE CROSS/BLUE SHIELD

## 2016-10-30 VITALS — BP 104/65 | HR 80 | Temp 98.6°F | Resp 18 | Wt 228.6 lb

## 2016-10-30 DIAGNOSIS — D75839 Thrombocytosis, unspecified: Secondary | ICD-10-CM

## 2016-10-30 DIAGNOSIS — D509 Iron deficiency anemia, unspecified: Secondary | ICD-10-CM | POA: Diagnosis not present

## 2016-10-30 DIAGNOSIS — D473 Essential (hemorrhagic) thrombocythemia: Secondary | ICD-10-CM

## 2016-10-30 MED ORDER — SODIUM CHLORIDE 0.9 % IV SOLN
Freq: Once | INTRAVENOUS | Status: AC
  Administered 2016-10-30: 14:00:00 via INTRAVENOUS

## 2016-10-30 MED ORDER — SODIUM CHLORIDE 0.9 % IV SOLN
510.0000 mg | Freq: Once | INTRAVENOUS | Status: AC
  Administered 2016-10-30: 510 mg via INTRAVENOUS
  Filled 2016-10-30: qty 17

## 2016-10-30 NOTE — Patient Instructions (Signed)
[facility] at [facility] Discharge Instructions  RECOMMENDATIONS MADE BY THE CONSULTANT AND ANY TEST RESULTS WILL BE SENT TO YOUR REFERRING PHYSICIAN.  Feraheme given today Follow up as scheduled.  Thank you for choosing [facility] at [facility] to provide your oncology and hematology care.  To afford each patient quality time with our provider, please arrive at least 15 minutes before your scheduled appointment time.    If you have a lab appointment with the Cancer Center please come in thru the  Main Entrance and check in at the main information desk  You need to re-schedule your appointment should you arrive 10 or more minutes late.  We strive to give you quality time with our providers, and arriving late affects you and other patients whose appointments are after yours.  Also, if you no show three or more times for appointments you may be dismissed from the clinic at the providers discretion.     Again, thank you for choosing [facility].  Our hope is that these requests will decrease the amount of time that you wait before being seen by our physicians.       _____________________________________________________________  Should you have questions after your visit to [facility], please contact our office at [phone] between the hours of 8:30 a.m. and 4:30 p.m.  Voicemails left after 4:30 p.m. will not be returned until the following business day.  For prescription refill requests, have your pharmacy contact our office.       Resources For Cancer Patients and their Caregivers ? American Cancer Society: Can assist with transportation, wigs, general needs, runs Look Good Feel Better.        1-888-227-6333 ? Cancer Care: Provides financial assistance, online support groups, medication/co-pay assistance.  1-800-813-HOPE (4673) ? [facility] Assists [place] cancer patients and  their families through emotional , educational and financial support.  336-427-4357 ? [place] DSS Where to apply for food stamps, Medicaid and utility assistance. 336-342-1394 ? RCATS: Transportation to medical appointments. 336-347-2287 ? Social Security Administration: May apply for disability if have a Stage IV cancer. 336-342-7796 1-800-772-1213 ? [place] Aging, Disability and Transit Services: Assists with nutrition, care and transit needs. 336-349-2343  Cancer Center Support Programs: @10RELATIVEDAYS@ > Cancer Support Group  2nd Tuesday of the month 1pm-2pm, Journey Room  > Creative Journey  3rd Tuesday of the month 1130am-1pm, Journey Room  > Look Good Feel Better  1st Wednesday of the month 10am-12 noon, Journey Room (Call [employer] to register 1-800-395-5775)   

## 2016-10-30 NOTE — Progress Notes (Signed)
Treatment given per orders. Patient tolerated it well without problems. Vitals stable and discharged home from clinic ambulatory. Follow up as scheduled.  

## 2016-10-31 ENCOUNTER — Ambulatory Visit (HOSPITAL_COMMUNITY): Payer: BLUE CROSS/BLUE SHIELD

## 2016-11-04 ENCOUNTER — Other Ambulatory Visit (HOSPITAL_COMMUNITY): Payer: Self-pay | Admitting: *Deleted

## 2016-11-04 DIAGNOSIS — D509 Iron deficiency anemia, unspecified: Secondary | ICD-10-CM

## 2016-11-04 LAB — OCCULT BLOOD X 1 CARD TO LAB, STOOL
Fecal Occult Bld: NEGATIVE
Fecal Occult Bld: NEGATIVE
Fecal Occult Bld: NEGATIVE

## 2016-11-11 ENCOUNTER — Ambulatory Visit (HOSPITAL_COMMUNITY): Payer: BLUE CROSS/BLUE SHIELD

## 2016-11-18 ENCOUNTER — Encounter: Payer: Self-pay | Admitting: Obstetrics & Gynecology

## 2016-11-18 ENCOUNTER — Ambulatory Visit (INDEPENDENT_AMBULATORY_CARE_PROVIDER_SITE_OTHER): Payer: BLUE CROSS/BLUE SHIELD | Admitting: Obstetrics & Gynecology

## 2016-11-18 VITALS — BP 132/72 | HR 84 | Ht 65.0 in | Wt 225.0 lb

## 2016-11-18 DIAGNOSIS — N946 Dysmenorrhea, unspecified: Secondary | ICD-10-CM | POA: Diagnosis not present

## 2016-11-18 DIAGNOSIS — N920 Excessive and frequent menstruation with regular cycle: Secondary | ICD-10-CM | POA: Diagnosis not present

## 2016-11-18 DIAGNOSIS — D508 Other iron deficiency anemias: Secondary | ICD-10-CM

## 2016-11-18 MED ORDER — MEGESTROL ACETATE 40 MG PO TABS: ORAL_TABLET | ORAL | 3 refills | Status: DC

## 2016-11-18 NOTE — Progress Notes (Signed)
Chief Complaint  Patient presents with  . Vaginal Bleeding    2 periods in 2 weeks      HPI:    41 y.o. G0P0000 Patient's last menstrual period was 11/09/2016 (exact date).  Patient is referred here from [place] [facility] and Dr. Wendi Snipes for further evaluation of a significant microcytic anemia I reviewed her CBCs for the last couple years and in May 2017 she had a hemoglobin 11.5 and MCV of 87 A month ago her hemoglobin was 7.5 with MCV of 71 She denies pica She states her menstrual periods are generally about 7 days with 2 days that are fairly light Moderate cramping Some clots Wear super pads but does not wear tampons Does not soil her sheets or closed although last year she had a couple months of heavy periods and had some swelling at that time but none in about a year  On a separate note she is not sexually active and has no intention of having kids in the future even when pressed Location:  Vaginal bleeding. Quality:  Moderate. Severity:  Moderate intensity moderate intensity. Timing:  Pretty much monthly. Duration:  7 days. Context:  Regular menstrual bleeding. Modifying factors:  Generally takes Advil for cramps Signs/Symptoms:  Not applicable    Current Outpatient Prescriptions:  .  ferrous sulfate 325 (65 FE) MG tablet, Take 1 tablet (325 mg total) by mouth 2 (two) times daily with a meal., Disp: 60 tablet, Rfl: 3 .  megestrol (MEGACE) 40 MG tablet, 3 tablets a day for 5 days, 2 tablets a day for 5 days then 1 tablet daily, Disp: 45 tablet, Rfl: 3  Problem Pertinent ROS:       No burning with urination, frequency or urgency No nausea, vomiting or diarrhea Nor fever chills or other constitutional symptoms   Extended ROS:        PMFSH:             Past Medical History:  Diagnosis Date  . Anemia   . HSV-1 (herpes simplex virus 1) infection     Past Surgical History:  Procedure Laterality Date  . ANTERIOR CRUCIATE LIGAMENT REPAIR  Left 2001    OB History    Gravida Para Term Preterm AB Living   0 0 0 0 0 0   SAB TAB Ectopic Multiple Live Births   0 0 0 0 0      No Known Allergies  Social History   Social History  . Marital status: Single    Spouse name: N/A  . Number of children: N/A  . Years of education: N/A   Social History Main Topics  . Smoking status: Never Smoker  . Smokeless tobacco: Never Used  . Alcohol use Yes  . Drug use: No  . Sexual activity: Not Currently    Birth control/ protection: None   Other Topics Concern  . None   Social History Narrative  . None    Family History  Problem Relation Age of Onset  . Cancer Father        lung  . Schizophrenia Brother   . Diabetes Brother   . Hypertension Brother   . Cancer Maternal Grandmother      Examination:  Vitals:  Blood pressure 132/72, pulse 84, height 5\' 5"  (1.651 m), weight 225 lb (102.1 kg), last menstrual period 11/09/2016.    Physical Examination:     General Appearance:  awake, alert, oriented, in no acute distress Skin:  there are no suspicious lesions or rashes of concern, warm Abdomen:  Soft, non-tender, normal bowel sounds; no bruits, organomegaly or masses.  Vulva:  normal appearing vulva with no masses, tenderness or lesions Vagina:  normal mucosa, no lesions or discharge noted Cervix:  no cervical motion tenderness, no lesions and nulliparous appearance Uterus:  normal size, contour, position, consistency, mobility, non-tender difficult to assess due to cntral obesity Adnexa: ovaries:present,  normal adnexa in size, nontender and no masses   DATA orders and reviews: Labs were not ordered today:   Imaging studies were ordered today:  Pelvic sonogram  Lab tests were reviewed today:   CBC, iron studies for the last few years Imaging studies were not reviewed today:    I did not independently review/view images, tracing or specimen(not simply the report) myself.    Prescription Drug Management:    Prescriptions prescribed this encounter:    Meds ordered this encounter  Medications  . megestrol (MEGACE) 40 MG tablet    Sig: 3 tablets a day for 5 days, 2 tablets a day for 5 days then 1 tablet daily    Dispense:  45 tablet    Refill:  3    Renewed Prescriptions:  none  Current prescription changes:  none   Impression/Plan(Problem Based):  There are no diagnoses linked to this encounter. 1.   Menorrhagia with regular cycle      (new problem) : Additional workup is needed:  Megestrol + sonogram  2.  Anemia most likely due to menorrhagia iron deficiency with an MCV of 71      (new problem:show no change) : Additional workup is not needed:  Patient is artery had IV iron for them from 510 mg she is taking oral iron as well and has no other source of bleeding, of note she is a Jehovah's Witness and absolutely would not take a transfusion}  3.   Dysmenorrhea      (new problem: show no change) : Additional workup is needed:  Sonogram  I'm going to place [place] 9 megestrol to try to stop her menstrual bleeding We will do a sonogram in one month and at that time I'll follow her up and see how she is done on the megestrol We will base her treatment based on her response to megestrol on the findings of the ultrasound   Follow Up:   Return in about 1 month (around 12/19/2016) for GYN sono, Follow up, with Dr Elonda Husky.   All questions were answered.

## 2016-11-20 ENCOUNTER — Other Ambulatory Visit (HOSPITAL_COMMUNITY): Payer: BLUE CROSS/BLUE SHIELD

## 2016-11-20 ENCOUNTER — Ambulatory Visit (HOSPITAL_COMMUNITY): Payer: BLUE CROSS/BLUE SHIELD

## 2016-11-22 ENCOUNTER — Other Ambulatory Visit (HOSPITAL_COMMUNITY): Payer: Self-pay | Admitting: *Deleted

## 2016-11-22 DIAGNOSIS — D509 Iron deficiency anemia, unspecified: Secondary | ICD-10-CM

## 2016-11-22 DIAGNOSIS — D75839 Thrombocytosis, unspecified: Secondary | ICD-10-CM

## 2016-11-22 DIAGNOSIS — D473 Essential (hemorrhagic) thrombocythemia: Secondary | ICD-10-CM

## 2016-11-25 ENCOUNTER — Encounter (HOSPITAL_COMMUNITY): Payer: Self-pay | Admitting: Oncology

## 2016-11-25 ENCOUNTER — Encounter (HOSPITAL_BASED_OUTPATIENT_CLINIC_OR_DEPARTMENT_OTHER): Payer: BLUE CROSS/BLUE SHIELD | Admitting: Oncology

## 2016-11-25 ENCOUNTER — Encounter (HOSPITAL_COMMUNITY): Payer: BLUE CROSS/BLUE SHIELD

## 2016-11-25 VITALS — BP 115/66 | HR 73 | Temp 98.7°F | Resp 16 | Wt 225.2 lb

## 2016-11-25 DIAGNOSIS — D509 Iron deficiency anemia, unspecified: Secondary | ICD-10-CM

## 2016-11-25 LAB — CBC WITH DIFFERENTIAL/PLATELET
BASOS ABS: 0.1 10*3/uL (ref 0.0–0.1)
Basophils Relative: 1 %
EOS PCT: 1 %
Eosinophils Absolute: 0.1 10*3/uL (ref 0.0–0.7)
HCT: 34.1 % — ABNORMAL LOW (ref 36.0–46.0)
HEMOGLOBIN: 10.7 g/dL — AB (ref 12.0–15.0)
Lymphocytes Relative: 31 %
Lymphs Abs: 1.7 10*3/uL (ref 0.7–4.0)
MCH: 25.5 pg — AB (ref 26.0–34.0)
MCHC: 31.4 g/dL (ref 30.0–36.0)
MCV: 81.4 fL (ref 78.0–100.0)
Monocytes Absolute: 0.4 10*3/uL (ref 0.1–1.0)
Monocytes Relative: 7 %
NEUTROS PCT: 60 %
Neutro Abs: 3.3 10*3/uL (ref 1.7–7.7)
Platelets: 404 10*3/uL — ABNORMAL HIGH (ref 150–400)
RBC: 4.19 MIL/uL (ref 3.87–5.11)
RDW: 22.9 % — ABNORMAL HIGH (ref 11.5–15.5)
WBC: 5.6 10*3/uL (ref 4.0–10.5)

## 2016-11-25 LAB — COMPREHENSIVE METABOLIC PANEL
ALK PHOS: 59 U/L (ref 38–126)
ALT: 19 U/L (ref 14–54)
AST: 18 U/L (ref 15–41)
Albumin: 3.9 g/dL (ref 3.5–5.0)
Anion gap: 7 (ref 5–15)
BUN: 10 mg/dL (ref 6–20)
CALCIUM: 9 mg/dL (ref 8.9–10.3)
CO2: 26 mmol/L (ref 22–32)
CREATININE: 0.74 mg/dL (ref 0.44–1.00)
Chloride: 104 mmol/L (ref 101–111)
Glucose, Bld: 82 mg/dL (ref 65–99)
Potassium: 3.8 mmol/L (ref 3.5–5.1)
SODIUM: 137 mmol/L (ref 135–145)
Total Bilirubin: 0.5 mg/dL (ref 0.3–1.2)
Total Protein: 7.3 g/dL (ref 6.5–8.1)

## 2016-11-25 LAB — FERRITIN: Ferritin: 38 ng/mL (ref 11–307)

## 2016-11-25 LAB — IRON AND TIBC
IRON: 51 ug/dL (ref 28–170)
Saturation Ratios: 16 % (ref 10.4–31.8)
TIBC: 328 ug/dL (ref 250–450)
UIBC: 277 ug/dL

## 2016-11-25 NOTE — Patient Instructions (Signed)
[place] at [facility] Discharge Instructions  RECOMMENDATIONS MADE BY THE CONSULTANT AND ANY TEST RESULTS WILL BE SENT TO YOUR REFERRING PHYSICIAN.  You were seen today by Dr. Twana First Follow up in 3 months with labs We will schedule you for one dose of feraheme   Thank you for choosing [place] at [facility] to provide your oncology and hematology care.  To afford each patient quality time with our provider, please arrive at least 15 minutes before your scheduled appointment time.    If you have a lab appointment with the [place] please come in thru the  Main Entrance and check in at the main information desk  You need to re-schedule your appointment should you arrive 10 or more minutes late.  We strive to give you quality time with our providers, and arriving late affects you and other patients whose appointments are after yours.  Also, if you no show three or more times for appointments you may be dismissed from the clinic at the providers discretion.     Again, thank you for choosing [facility].  Our hope is that these requests will decrease the amount of time that you wait before being seen by our physicians.       _____________________________________________________________  Should you have questions after your visit to [facility], please contact our office at [phone] between the hours of 8:30 a.m. and 4:30 p.m.  Voicemails left after 4:30 p.m. will not be returned until the following business day.  For prescription refill requests, have your pharmacy contact our office.       Resources For Cancer Patients and their Caregivers ? American Cancer Society: Can assist with transportation, wigs, general needs, runs Look Good Feel Better.        (609)847-3968 ? Cancer Care: Provides financial assistance, online support groups, medication/co-pay assistance.  1-800-813-HOPE 505-602-6475) ? [facility] Assists [place] cancer patients and their families through emotional , educational and financial support.  940-075-7343 ? [place] DSS Where to apply for food stamps, Medicaid and utility assistance. 432-753-8078 ? RCATS: Transportation to medical appointments. (985)634-8070 ? Social Security Administration: May apply for disability if have a Stage IV cancer. 903-046-5235 613-445-5944 ? [employer], Disability and Transit Services: Assists with nutrition, care and transit needs. [place] Support Programs: @10RELATIVEDAYS @ > Cancer Support Group  2nd Tuesday of the month 1pm-2pm, Journey Room  > Creative Journey  3rd Tuesday of the month 1130am-1pm, Journey Room  > Look Good Feel Better  1st Wednesday of the month 10am-12 noon, Journey Room (Call [place] to register [phone])

## 2016-11-25 NOTE — Progress Notes (Signed)
[place] @ [facility] [phone]  Fax:(336) [place] OB: 1975-11-06  MR#: [id number]  [id number]#:[id number]  Patient Care Team: Timmothy Euler, MD as PCP - General (Family Medicine)  CHIEF COMPLAINT:  Chief Complaint  Patient presents with  . Follow-up    VISIT DIAGNOSIS:     ICD-10-CM   1. Microcytic anemia D50.9 CBC with Differential    Comprehensive metabolic panel    Iron and TIBC    Ferritin    Iron deficiency anemia  Oncology Flowsheet 10/30/2016  ferumoxytol (FERAHEME) IV 510 mg    INTERVAL HISTORY:   Presents today for continued follow-up.  Since her last visit she had 3 fecal occult blood test which were all negative.  She seen her gynecologist who started her on Megace for her irregular periods, but she has not gotten the prescription filled yet.  Today her hemoglobin is 10.7 g/dL and platelets are 404K.  She received 1 dose of Feraheme on 10/30/2016.  She denies feeling any difference after getting the dose of Feraheme.  She denies any fatigue, chest pain, shortness breath, abdominal pain, focal weakness.  She continues to take ferrous sulfate 325 mg p.o. twice daily and complains of constipation due to the iron tablets.  REVIEW OF SYSTEMS: ROS as per HPI otherwise 12 point ROS is negative.  PAST MEDICAL HISTORY: No significant past medical history PAST SURGICAL HISTORY: Past Surgical History:  Procedure Laterality Date  . ANTERIOR CRUCIATE LIGAMENT REPAIR Left 2001    FAMILY HISTORY Family History  Problem Relation Age of Onset  . Cancer Father        lung  . Schizophrenia Brother   . Diabetes Brother   . Hypertension Brother   . Cancer Maternal Grandmother     GYNECOLOGIC HISTORY:  Last menstrual cycle was a week ago.  Denies any history of excessive menstrual bleeding    ADVANCED DIRECTIVES:  Patient does  NOT  have advance healthcare directive, Patient   does not desire to make any  changes  HEALTH MAINTENANCE: Social History  Substance Use Topics  . Smoking status: Never Smoker  . Smokeless tobacco: Never Used  . Alcohol use Yes     Colonoscopy:  PAP:  Bone density:  Lipid panel:  No Known Allergies  Current Outpatient Prescriptions  Medication Sig Dispense Refill  . ferrous sulfate 325 (65 FE) MG tablet Take 1 tablet (325 mg total) by mouth 2 (two) times daily with a meal. 60 tablet 3  . megestrol (MEGACE) 40 MG tablet 3 tablets a day for 5 days, 2 tablets a day for 5 days then 1 tablet daily (Patient not taking: Reported on 11/25/2016) 45 tablet 3   No current facility-administered medications for this visit.     OBJECTIVE: Vitals:   11/25/16 1528  BP: 115/66  Pulse: 73  Resp: 16  Temp: 98.7 F (37.1 C)  SpO2: 100%    Constitutional: Well-developed, well-nourished, and in no distress.   HENT:  Head: Normocephalic and atraumatic.  Mouth/Throat: No oropharyngeal exudate. Mucosa moist. Eyes: Pupils are equal, round, and reactive to light. Conjunctivae are normal. No scleral icterus.  Neck: Normal range of motion. Neck supple. No JVD present.  Cardiovascular: Normal rate, regular rhythm and normal heart sounds.  Exam reveals no gallop and no friction rub.   No murmur heard. Pulmonary/Chest: Effort normal and breath sounds normal. No respiratory distress. No wheezes.No rales.  Abdominal: Soft. Bowel sounds are normal. No distension. There  is no tenderness. There is no guarding.  Musculoskeletal: No edema or tenderness.  Lymphadenopathy:    No cervical or supraclavicular adenopathy.  Neurological: Alert and oriented to person, place, and time. No cranial nerve deficit.  Skin: Skin is warm and dry. No rash noted. No erythema. No pallor.  Psychiatric: Affect and judgment normal.    Vitals:   11/25/16 1528  BP: 115/66  Pulse: 73  Resp: 16  Temp: 98.7 F (37.1 C)  SpO2: 100%     Body mass index is 37.48 kg/m.    ECOG FS:0 -  Asymptomatic  LAB RESULTS:  Appointment on 11/25/2016  Component Date Value Ref Range Status  . WBC 11/25/2016 5.6  4.0 - 10.5 K/uL Final  . RBC 11/25/2016 4.19  3.87 - 5.11 MIL/uL Final  . Hemoglobin 11/25/2016 10.7* 12.0 - 15.0 g/dL Final  . HCT 11/25/2016 34.1* 36.0 - 46.0 % Final  . MCV 11/25/2016 81.4  78.0 - 100.0 fL Final  . MCH 11/25/2016 25.5* 26.0 - 34.0 pg Final  . MCHC 11/25/2016 31.4  30.0 - 36.0 g/dL Final  . RDW 11/25/2016 22.9* 11.5 - 15.5 % Final  . Platelets 11/25/2016 404* 150 - 400 K/uL Final  . Neutrophils Relative % 11/25/2016 60  % Final  . Lymphocytes Relative 11/25/2016 31  % Final  . Monocytes Relative 11/25/2016 7  % Final  . Eosinophils Relative 11/25/2016 1  % Final  . Basophils Relative 11/25/2016 1  % Final  . Neutro Abs 11/25/2016 3.3  1.7 - 7.7 K/uL Final  . Lymphs Abs 11/25/2016 1.7  0.7 - 4.0 K/uL Final  . Monocytes Absolute 11/25/2016 0.4  0.1 - 1.0 K/uL Final  . Eosinophils Absolute 11/25/2016 0.1  0.0 - 0.7 K/uL Final  . 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  Continue follow-up with her OB/GYN. -Patient has had a great response to IV iron with hemoglobin improving from 7.9 g/dL up to 10.7 g/dL.  Patient still has a thrombocytosis although it has improved since she received Feraheme. -I will set her up for another dose of Feraheme since she has room for improvement. -I have discussed with her that after she receives the [facility] she can change her ferrous sulfate oral to just once daily. -Return to clinic in 3 months for follow-up with repeat lab work as stated below.  Orders Placed This Encounter  Procedures  . CBC with Differential    Standing  Status:   Future    Standing Expiration Date:   11/25/2017  . Comprehensive metabolic panel    Standing Status:   Future    Standing Expiration Date:   11/25/2017  . Iron and TIBC    Standing Status:   Future    Standing Expiration Date:   11/25/2017  . Ferritin    Standing Status:   Future    Standing Expiration Date:   11/25/2017    Twana First, MD   11/25/2016 3:37 PM

## 2016-12-02 ENCOUNTER — Ambulatory Visit (HOSPITAL_COMMUNITY): Payer: BLUE CROSS/BLUE SHIELD

## 2016-12-02 ENCOUNTER — Encounter (HOSPITAL_COMMUNITY): Payer: BLUE CROSS/BLUE SHIELD | Attending: Oncology

## 2016-12-02 ENCOUNTER — Encounter (HOSPITAL_COMMUNITY): Payer: Self-pay

## 2016-12-02 VITALS — BP 105/69 | HR 77 | Temp 98.6°F | Resp 16

## 2016-12-02 DIAGNOSIS — D75839 Thrombocytosis, unspecified: Secondary | ICD-10-CM

## 2016-12-02 DIAGNOSIS — D509 Iron deficiency anemia, unspecified: Secondary | ICD-10-CM | POA: Insufficient documentation

## 2016-12-02 DIAGNOSIS — D473 Essential (hemorrhagic) thrombocythemia: Secondary | ICD-10-CM

## 2016-12-02 MED ORDER — SODIUM CHLORIDE 0.9 % IV SOLN
Freq: Once | INTRAVENOUS | Status: AC
  Administered 2016-12-02: 15:00:00 via INTRAVENOUS

## 2016-12-02 MED ORDER — SODIUM CHLORIDE 0.9 % IV SOLN
510.0000 mg | Freq: Once | INTRAVENOUS | Status: AC
  Administered 2016-12-02: 510 mg via INTRAVENOUS
  Filled 2016-12-02: qty 17

## 2016-12-02 NOTE — Progress Notes (Signed)
Treatment given per orders. Patient tolerated it well without problems. Vitals stable and discharged home from clinic ambulatory. Follow up as scheduled.  

## 2016-12-02 NOTE — Patient Instructions (Signed)
[facility] at [facility] Discharge Instructions  RECOMMENDATIONS MADE BY THE CONSULTANT AND ANY TEST RESULTS WILL BE SENT TO YOUR REFERRING PHYSICIAN.  feraheme given today Follow up as scheduled  Thank you for choosing [facility] at [facility] to provide your oncology and hematology care.  To afford each patient quality time with our provider, please arrive at least 15 minutes before your scheduled appointment time.    If you have a lab appointment with the Cancer Center please come in thru the  Main Entrance and check in at the main information desk  You need to re-schedule your appointment should you arrive 10 or more minutes late.  We strive to give you quality time with our providers, and arriving late affects you and other patients whose appointments are after yours.  Also, if you no show three or more times for appointments you may be dismissed from the clinic at the providers discretion.     Again, thank you for choosing [facility].  Our hope is that these requests will decrease the amount of time that you wait before being seen by our physicians.       _____________________________________________________________  Should you have questions after your visit to  Cancer Center, please contact our office at [phone] between the hours of 8:30 a.m. and 4:30 p.m.  Voicemails left after 4:30 p.m. will not be returned until the following business day.  For prescription refill requests, have your pharmacy contact our office.       Resources For Cancer Patients and their Caregivers ? American Cancer Society: Can assist with transportation, wigs, general needs, runs Look Good Feel Better.        1-888-227-6333 ? Cancer Care: Provides financial assistance, online support groups, medication/co-pay assistance.  1-800-813-HOPE (4673) ? [facility] Assists [place] cancer patients and  their families through emotional , educational and financial support.  336-427-4357 ? [place] DSS Where to apply for food stamps, Medicaid and utility assistance. 336-342-1394 ? [facility]: Transportation to medical appointments. 336-347-2287 ? Social Security Administration: May apply for disability if have a Stage IV cancer. 336-342-7796 1-800-772-1213 ? [place] Aging, Disability and Transit Services: Assists with nutrition, care and transit needs. 336-349-2343  Cancer Center Support Programs: [email or web address]@ > Cancer Support Group  2nd Tuesday of the month 1pm-2pm, Journey Room  > Creative Journey  3rd Tuesday of the month 1130am-1pm, Journey Room  > Look Good Feel Better  1st Wednesday of the month 10am-12 noon, Journey Room (Call American Cancer Society to register 1-800-395-5775)   

## 2016-12-16 ENCOUNTER — Other Ambulatory Visit: Payer: Self-pay

## 2016-12-16 MED ORDER — FERROUS SULFATE 325 (65 FE) MG PO TABS: 325.0000 mg | ORAL_TABLET | Freq: Two times a day (BID) | ORAL | 0 refills | Status: DC

## 2016-12-23 ENCOUNTER — Encounter: Payer: Self-pay | Admitting: Obstetrics & Gynecology

## 2016-12-23 ENCOUNTER — Other Ambulatory Visit: Payer: Self-pay

## 2016-12-23 ENCOUNTER — Ambulatory Visit (INDEPENDENT_AMBULATORY_CARE_PROVIDER_SITE_OTHER): Payer: BLUE CROSS/BLUE SHIELD

## 2016-12-23 ENCOUNTER — Ambulatory Visit (INDEPENDENT_AMBULATORY_CARE_PROVIDER_SITE_OTHER): Payer: BLUE CROSS/BLUE SHIELD | Admitting: Obstetrics & Gynecology

## 2016-12-23 DIAGNOSIS — D250 Submucous leiomyoma of uterus: Secondary | ICD-10-CM

## 2016-12-23 DIAGNOSIS — N920 Excessive and frequent menstruation with regular cycle: Secondary | ICD-10-CM

## 2016-12-23 DIAGNOSIS — D509 Iron deficiency anemia, unspecified: Secondary | ICD-10-CM | POA: Diagnosis not present

## 2016-12-23 DIAGNOSIS — Z862 Personal history of diseases of the blood and blood-forming organs and certain disorders involving the immune mechanism: Secondary | ICD-10-CM | POA: Diagnosis not present

## 2016-12-23 DIAGNOSIS — D252 Subserosal leiomyoma of uterus: Secondary | ICD-10-CM

## 2016-12-23 DIAGNOSIS — N946 Dysmenorrhea, unspecified: Secondary | ICD-10-CM | POA: Diagnosis not present

## 2016-12-23 LAB — POCT HEMOGLOBIN: HEMOGLOBIN: 11.2 g/dL — AB (ref 12.2–16.2)

## 2016-12-23 NOTE — Progress Notes (Signed)
PELVIC US TA/TV: enlarged heterogeneous anteverted uterus w/mult fibroid,largest fibroids:(#1) subserosal fundal fibroid 5.8 x 4.7 x 5.1 cm,(#2) submucosal fibroid 4.3 x 3.3 x 2.1 cm,thickened EEC 28 mm,endometrium is distorted by submucosal fibroid,normal ovaries bilat,left ovary appears to be mobile,right ovary is difficult to slide because of location,no pain during ultrasound

## 2016-12-23 NOTE — Progress Notes (Signed)
Follow up appointment for results  Chief Complaint  Patient presents with  . Follow-up    u/s today    There were no vitals taken for this visit.  US Pelvis Transvanginal Non-ob (tv Only)  Result Date: 12/23/2016 GYNECOLOGIC SONOGRAM Janice Galvan is a 41 y.o. G0P0000 LMP 11/09/2016,she is here for a pelvic sonogram for menorrhagia. Uterus                      10.2 x 7.5 x 8.7 cm, vol 346 ml,enlarged heterogeneous anteverted uterus w/mult fibroid Endometrium          28 mm, asymmetrical ,the endometrium is thickened and distorted by submucosal fibroid Right ovary             3.3 x 1.6 x 2.5 cm, wnl Left ovary                4 x 2 x 2.1 cm,wnl Largest fibroids:(#1) subserosal fundal fibroid 5.8 x 4.7 x 5.1 cm,(#2) submucosal fibroid 4.3 x 3.3 x 2.1 cm Technician Comments: PELVIC US TA/TV: enlarged heterogeneous anteverted uterus w/mult fibroid,largest fibroids:(#1) subserosal fundal fibroid 5.8 x 4.7 x 5.1 cm,(#2) submucosal fibroid 4.3 x 3.3 x 2.1 cm,thickened EEC 28 mm,endometrium is distorted by submucosal fibroid,normal ovaries bilat,left ovary appears to be mobile,right ovary is difficult to slide because of location,no pain during ultrasound [employer] 12/23/2016 3:50 PM Clinical Impression and recommendations: I have reviewed the sonogram results above, combined with the patient's current clinical course, below are my impressions and any appropriate recommendations for management based on the sonographic findings. Enlarge uterus with multiple fibroids Endometrium is distorted by submucosal myoma Both ovaries are normal Janice Galvan 12/23/2016 4:05 PM   US Pelvis (transabdominal Only)  Result Date: 12/23/2016 GYNECOLOGIC SONOGRAM Janice Galvan is a 41 y.o. G0P0000 LMP 11/09/2016,she is here for a pelvic sonogram for menorrhagia. Uterus                      10.2 x 7.5 x 8.7 cm, vol 346 ml,enlarged heterogeneous anteverted uterus w/mult fibroid Endometrium          28 mm,  asymmetrical ,the endometrium is thickened and distorted by submucosal fibroid Right ovary             3.3 x 1.6 x 2.5 cm, wnl Left ovary                4 x 2 x 2.1 cm,wnl Largest fibroids:(#1) subserosal fundal fibroid 5.8 x 4.7 x 5.1 cm,(#2) submucosal fibroid 4.3 x 3.3 x 2.1 cm Technician Comments: PELVIC US TA/TV: enlarged heterogeneous anteverted uterus w/mult fibroid,largest fibroids:(#1) subserosal fundal fibroid 5.8 x 4.7 x 5.1 cm,(#2) submucosal fibroid 4.3 x 3.3 x 2.1 cm,thickened EEC 28 mm,endometrium is distorted by submucosal fibroid,normal ovaries bilat,left ovary appears to be mobile,right ovary is difficult to slide because of location,no pain during ultrasound [employer] 12/23/2016 3:50 PM Clinical Impression and recommendations: I have reviewed the sonogram results above, combined with the patient's current clinical course, below are my impressions and any appropriate recommendations for management based on the sonographic findings. Enlarge uterus with multiple fibroids Endometrium is distorted by submucosal myoma Both ovaries are normal Janice Galvan 12/23/2016 4:05 PM      MEDS ordered this encounter: No orders of the defined types were placed in this encounter.   Orders for this encounter: No orders of the defined types were placed in this encounter.  Impression: Submucous and subserous leiomyoma of uterus  Dysmenorrhea  Microcytic anemia  Menorrhagia with regular cycle    Plan: I discussed the patient's findings and her sonogram She understands she has multiple fibroids with a significantly enlarged fibroid uterus approximately 12-14 weeks size Specifically she has a fibroid in the submucosal location which is probably largely responsible for her bleeding problems As related at her previous visit she has no intention of having any children in the future and definitive therapy is certainly an option Endometrial ablation is not an option because of her  fibroids She could continue the Megace for at least some period of time to try to manage her bleeding She of course had a hemoglobin in the 7s recently and she is a [place] practicing in does not want to have a transfusion Out of respect to that I recommended that we proceed with an abdominal supracervical hysterectomy with removal of tubes preservation of her cervix and her ovaries Additionally her hemoglobin today is 29.9 and certainly has had a marketed improvement without the bleeding on the Megace She has had 2 iron infusions as well and so I think this is our window of opportunity to do this most safely Patient is aware that surgery including hospital stay postoperative care etc. including returning to her work schedule Additionally she will be hormonally unaltered and that has made clear to her  Weeks discussed some specific dates for surgery and she is going to get back to me with her decision regarding timing and if indeed she decides to go forward  Follow Up: Return if symptoms worsen or fail to improve.     All questions were answered.  Past Medical History:  Diagnosis Date  . Anemia   . HSV-1 (herpes simplex virus 1) infection     Past Surgical History:  Procedure Laterality Date  . ANTERIOR CRUCIATE LIGAMENT REPAIR Left 2001    OB History    Gravida Para Term Preterm AB Living   0 0 0 0 0 0   SAB TAB Ectopic Multiple Live Births   0 0 0 0 0      No Known Allergies  Social History   Socioeconomic History  . Marital status: Single    Spouse name: None  . Number of children: None  . Years of education: None  . Highest education level: None  Social Needs  . Financial resource strain: None  . Food insecurity - worry: None  . Food insecurity - inability: None  . Transportation needs - medical: None  . Transportation needs - non-medical: None  Occupational History  . None  Tobacco Use  . Smoking status: Never Smoker  . Smokeless tobacco: Never  Used  Substance and Sexual Activity  . Alcohol use: Yes  . Drug use: No  . Sexual activity: Not Currently    Birth control/protection: None  Other Topics Concern  . None  Social History Narrative  . None    Family History  Problem Relation Age of Onset  . Cancer Father        lung  . Schizophrenia Brother   . Diabetes Brother   . Hypertension Brother   . Cancer Maternal Grandmother

## 2016-12-30 ENCOUNTER — Encounter: Payer: Self-pay | Admitting: Obstetrics & Gynecology

## 2017-01-01 ENCOUNTER — Encounter: Payer: Self-pay | Admitting: Obstetrics & Gynecology

## 2017-01-03 DIAGNOSIS — Z789 Other specified health status: Secondary | ICD-10-CM | POA: Diagnosis not present

## 2017-01-03 DIAGNOSIS — D250 Submucous leiomyoma of uterus: Secondary | ICD-10-CM | POA: Insufficient documentation

## 2017-01-03 DIAGNOSIS — D252 Subserosal leiomyoma of uterus: Secondary | ICD-10-CM

## 2017-01-03 DIAGNOSIS — D500 Iron deficiency anemia secondary to blood loss (chronic): Secondary | ICD-10-CM | POA: Diagnosis not present

## 2017-01-03 DIAGNOSIS — N920 Excessive and frequent menstruation with regular cycle: Secondary | ICD-10-CM | POA: Diagnosis not present

## 2017-01-10 ENCOUNTER — Other Ambulatory Visit (HOSPITAL_COMMUNITY): Payer: BLUE CROSS/BLUE SHIELD

## 2017-01-13 ENCOUNTER — Inpatient Hospital Stay (HOSPITAL_COMMUNITY): Admission: RE | Admit: 2017-01-13 | Payer: BLUE CROSS/BLUE SHIELD | Source: Ambulatory Visit

## 2017-01-15 ENCOUNTER — Inpatient Hospital Stay (HOSPITAL_COMMUNITY): Admit: 2017-01-15 | Payer: BLUE CROSS/BLUE SHIELD | Admitting: Obstetrics & Gynecology

## 2017-01-15 ENCOUNTER — Encounter (HOSPITAL_COMMUNITY): Payer: Self-pay

## 2017-01-15 SURGERY — HYSTERECTOMY, SUPRACERVICAL, ABDOMINAL
Anesthesia: General

## 2017-01-22 ENCOUNTER — Encounter: Payer: BLUE CROSS/BLUE SHIELD | Admitting: Obstetrics & Gynecology

## 2017-02-13 ENCOUNTER — Other Ambulatory Visit (HOSPITAL_COMMUNITY): Payer: Self-pay | Admitting: *Deleted

## 2017-02-13 DIAGNOSIS — D509 Iron deficiency anemia, unspecified: Secondary | ICD-10-CM

## 2017-02-17 ENCOUNTER — Inpatient Hospital Stay (HOSPITAL_COMMUNITY): Payer: BLUE CROSS/BLUE SHIELD | Attending: Oncology

## 2017-02-17 DIAGNOSIS — D509 Iron deficiency anemia, unspecified: Secondary | ICD-10-CM | POA: Diagnosis not present

## 2017-02-17 LAB — IRON AND TIBC
Iron: 59 ug/dL (ref 28–170)
SATURATION RATIOS: 19 % (ref 10.4–31.8)
TIBC: 315 ug/dL (ref 250–450)
UIBC: 256 ug/dL

## 2017-02-17 LAB — CBC WITH DIFFERENTIAL/PLATELET
Basophils Absolute: 0 10*3/uL (ref 0.0–0.1)
Basophils Relative: 0 %
Eosinophils Absolute: 0 10*3/uL (ref 0.0–0.7)
Eosinophils Relative: 1 %
HEMATOCRIT: 32.3 % — AB (ref 36.0–46.0)
Hemoglobin: 10.1 g/dL — ABNORMAL LOW (ref 12.0–15.0)
LYMPHS ABS: 1.9 10*3/uL (ref 0.7–4.0)
LYMPHS PCT: 28 %
MCH: 30.3 pg (ref 26.0–34.0)
MCHC: 31.3 g/dL (ref 30.0–36.0)
MCV: 97 fL (ref 78.0–100.0)
MONOS PCT: 4 %
Monocytes Absolute: 0.2 10*3/uL (ref 0.1–1.0)
NEUTROS ABS: 4.6 10*3/uL (ref 1.7–7.7)
Neutrophils Relative %: 67 %
Platelets: 524 10*3/uL — ABNORMAL HIGH (ref 150–400)
RBC: 3.33 MIL/uL — AB (ref 3.87–5.11)
RDW: 13 % (ref 11.5–15.5)
WBC: 6.7 10*3/uL (ref 4.0–10.5)

## 2017-02-17 LAB — FERRITIN: Ferritin: 72 ng/mL (ref 11–307)

## 2017-02-17 LAB — COMPREHENSIVE METABOLIC PANEL
ALBUMIN: 4.1 g/dL (ref 3.5–5.0)
ALT: 17 U/L (ref 14–54)
AST: 20 U/L (ref 15–41)
Alkaline Phosphatase: 63 U/L (ref 38–126)
Anion gap: 9 (ref 5–15)
BUN: 8 mg/dL (ref 6–20)
CHLORIDE: 107 mmol/L (ref 101–111)
CO2: 22 mmol/L (ref 22–32)
Calcium: 8.9 mg/dL (ref 8.9–10.3)
Creatinine, Ser: 0.82 mg/dL (ref 0.44–1.00)
GFR calc Af Amer: >60 mL/min (ref >60)
GFR calc non Af Amer: >60 mL/min (ref >60)
GLUCOSE: 89 mg/dL (ref 65–99)
POTASSIUM: 3.6 mmol/L (ref 3.5–5.1)
Sodium: 138 mmol/L (ref 135–145)
Total Bilirubin: 0.8 mg/dL (ref 0.3–1.2)
Total Protein: 7.7 g/dL (ref 6.5–8.1)

## 2017-02-18 ENCOUNTER — Other Ambulatory Visit (HOSPITAL_COMMUNITY): Payer: BLUE CROSS/BLUE SHIELD

## 2017-02-18 ENCOUNTER — Other Ambulatory Visit (HOSPITAL_COMMUNITY): Payer: Self-pay | Admitting: Adult Health

## 2017-02-24 ENCOUNTER — Inpatient Hospital Stay (HOSPITAL_COMMUNITY): Payer: BLUE CROSS/BLUE SHIELD | Admitting: Internal Medicine

## 2017-02-24 ENCOUNTER — Ambulatory Visit (HOSPITAL_COMMUNITY): Payer: BLUE CROSS/BLUE SHIELD

## 2017-02-24 DIAGNOSIS — D250 Submucous leiomyoma of uterus: Secondary | ICD-10-CM | POA: Diagnosis not present

## 2017-02-24 DIAGNOSIS — D500 Iron deficiency anemia secondary to blood loss (chronic): Secondary | ICD-10-CM | POA: Diagnosis not present

## 2017-02-24 DIAGNOSIS — N938 Other specified abnormal uterine and vaginal bleeding: Secondary | ICD-10-CM | POA: Diagnosis not present

## 2017-02-24 DIAGNOSIS — Z789 Other specified health status: Secondary | ICD-10-CM | POA: Diagnosis not present

## 2017-02-25 ENCOUNTER — Ambulatory Visit (HOSPITAL_COMMUNITY): Payer: BLUE CROSS/BLUE SHIELD

## 2017-02-28 HISTORY — PX: ENDOMETRIAL ABLATION: SHX621

## 2017-03-03 ENCOUNTER — Encounter (HOSPITAL_COMMUNITY): Payer: Self-pay

## 2017-03-03 ENCOUNTER — Inpatient Hospital Stay (HOSPITAL_COMMUNITY): Payer: BLUE CROSS/BLUE SHIELD | Attending: Oncology

## 2017-03-03 VITALS — BP 121/68 | HR 80 | Temp 98.0°F | Resp 16

## 2017-03-03 DIAGNOSIS — Z79899 Other long term (current) drug therapy: Secondary | ICD-10-CM | POA: Diagnosis not present

## 2017-03-03 DIAGNOSIS — D473 Essential (hemorrhagic) thrombocythemia: Secondary | ICD-10-CM | POA: Diagnosis not present

## 2017-03-03 DIAGNOSIS — N946 Dysmenorrhea, unspecified: Secondary | ICD-10-CM | POA: Insufficient documentation

## 2017-03-03 DIAGNOSIS — D509 Iron deficiency anemia, unspecified: Secondary | ICD-10-CM | POA: Insufficient documentation

## 2017-03-03 DIAGNOSIS — Z801 Family history of malignant neoplasm of trachea, bronchus and lung: Secondary | ICD-10-CM | POA: Insufficient documentation

## 2017-03-03 DIAGNOSIS — D75839 Thrombocytosis, unspecified: Secondary | ICD-10-CM

## 2017-03-03 DIAGNOSIS — Z808 Family history of malignant neoplasm of other organs or systems: Secondary | ICD-10-CM | POA: Insufficient documentation

## 2017-03-03 MED ORDER — SODIUM CHLORIDE 0.9 % IV SOLN
510.0000 mg | Freq: Once | INTRAVENOUS | Status: AC
  Administered 2017-03-03: 510 mg via INTRAVENOUS
  Filled 2017-03-03: qty 17

## 2017-03-03 MED ORDER — SODIUM CHLORIDE 0.9 % IV SOLN
INTRAVENOUS | Status: DC
  Administered 2017-03-03: 15:00:00 via INTRAVENOUS

## 2017-03-03 MED ORDER — SODIUM CHLORIDE 0.9% FLUSH
3.0000 mL | Freq: Once | INTRAVENOUS | Status: AC | PRN
  Administered 2017-03-03: 3 mL via INTRAVENOUS

## 2017-03-03 NOTE — Progress Notes (Signed)
Patient tolerated iron infusion with no complaints voiced. Peripheral IV site clean and dry with no bruising or swelling noted at site.  Good blood return noted before and after administration of iron.  Band aid applied.  VSs with discharge and left ambulatory with no s/s of distress noted.

## 2017-03-03 NOTE — Patient Instructions (Signed)
[facility] at [facility]  Discharge Instructions:  You received an iron infusion today.  _______________________________________________________________  Thank you for choosing [facility] at [facility] to provide your oncology and hematology care.  To afford each patient quality time with our providers, please arrive at least 15 minutes before your scheduled appointment.  You need to re-schedule your appointment if you arrive 10 or more minutes late.  We strive to give you quality time with our providers, and arriving late affects you and other patients whose appointments are after yours.  Also, if you no show three or more times for appointments you may be dismissed from the clinic.  Again, thank you for choosing [facility] at [facility]. Our hope is that these requests will allow you access to exceptional care and in a timely manner. _______________________________________________________________  If you have questions after your visit, please contact our office at [phone] between the hours of 8:30 a.m. and 5:00 p.m. Voicemails left after 4:30 p.m. will not be returned until the following business day. _______________________________________________________________  For prescription refill requests, have your pharmacy contact our office. _______________________________________________________________  Recommendations made by the consultant and any test results will be sent to your referring physician. _______________________________________________________________ 

## 2017-03-10 ENCOUNTER — Other Ambulatory Visit: Payer: Self-pay | Admitting: *Deleted

## 2017-03-11 MED ORDER — MEGESTROL ACETATE 40 MG PO TABS: 40.0000 mg | ORAL_TABLET | Freq: Every day | ORAL | 11 refills | Status: DC

## 2017-03-13 ENCOUNTER — Other Ambulatory Visit: Payer: Self-pay | Admitting: Family Medicine

## 2017-03-17 ENCOUNTER — Encounter (HOSPITAL_COMMUNITY): Payer: Self-pay | Admitting: Oncology

## 2017-03-17 ENCOUNTER — Inpatient Hospital Stay (HOSPITAL_BASED_OUTPATIENT_CLINIC_OR_DEPARTMENT_OTHER): Payer: BLUE CROSS/BLUE SHIELD | Admitting: Oncology

## 2017-03-17 VITALS — BP 109/70 | HR 88 | Temp 98.3°F | Resp 18 | Wt 216.0 lb

## 2017-03-17 DIAGNOSIS — D473 Essential (hemorrhagic) thrombocythemia: Secondary | ICD-10-CM | POA: Diagnosis not present

## 2017-03-17 DIAGNOSIS — Z801 Family history of malignant neoplasm of trachea, bronchus and lung: Secondary | ICD-10-CM | POA: Diagnosis not present

## 2017-03-17 DIAGNOSIS — Z808 Family history of malignant neoplasm of other organs or systems: Secondary | ICD-10-CM | POA: Diagnosis not present

## 2017-03-17 DIAGNOSIS — D509 Iron deficiency anemia, unspecified: Secondary | ICD-10-CM | POA: Diagnosis not present

## 2017-03-17 DIAGNOSIS — Z79899 Other long term (current) drug therapy: Secondary | ICD-10-CM | POA: Diagnosis not present

## 2017-03-17 DIAGNOSIS — D75839 Thrombocytosis, unspecified: Secondary | ICD-10-CM

## 2017-03-17 DIAGNOSIS — N946 Dysmenorrhea, unspecified: Secondary | ICD-10-CM

## 2017-03-17 NOTE — Progress Notes (Signed)
[place] @ [facility] [phone]  Fax:([phone] [place]  Astra Gregg OB: [birth year]  MR#: [id number]  AYT#:[id number]  Patient Care Team: Timmothy Euler, MD as PCP - General (Family Medicine)  CHIEF COMPLAINT:  Chief Complaint  Patient presents with  . Follow-up    VISIT DIAGNOSIS:     ICD-10-CM   1. Thrombocytosis (HCC) D47.3     Iron deficiency anemia  Oncology Flowsheet 10/30/2016 12/02/2016 03/03/2017  ferumoxytol (FERAHEME) IV 510 mg 510 mg 510 mg    INTERVAL HISTORY:  Patient presents today for continued follow-up. Patient has had 3 fecal occult blood tests which were all negative. She has been seen by her gynecologist who has started her on Megace and more recently Northedone 5 mg daily for heavy menstrual periods. She states that both medications worked initially but has stopped working at this time. Most recent labs were from 02/17/2017 revealing a hemoglobin of 10.1. She was given 1 dose of IV Feraheme on 03/03/2017. She denies any difference after getting the IV iron. She is scheduled to have a uterine ablation on 03/27/2017 by a gynecologist in [place]. She'll be having pre-op labs drawn on Monday so will not ask her to draw them today. I have asked that she add a ferritin level to her labs if possible. She denies fatigue, chest pain, shortness of breath, abdominal pain, weakness. She continues to take her ferrous sulfate 325 mg by mouth twice a day.  REVIEW OF SYSTEMS: ROS as per HPI otherwise 12 point ROS is negative.  PAST MEDICAL HISTORY: No significant past medical history PAST SURGICAL HISTORY: Past Surgical History:  Procedure Laterality Date  . ANTERIOR CRUCIATE LIGAMENT REPAIR Left 2001    FAMILY HISTORY Family History  Problem Relation Age of Onset  . Cancer Father        lung  . Schizophrenia Brother   . Diabetes Brother   . Hypertension Brother   . Cancer Maternal Grandmother       ADVANCED DIRECTIVES:    Patient does  NOT  have advance healthcare directive, Patient   does not desire to make any changes  HEALTH MAINTENANCE: Social History   Tobacco Use  . Smoking status: Never Smoker  . Smokeless tobacco: Never Used  Substance Use Topics  . Alcohol use: Yes  . Drug use: No     Colonoscopy:  PAP:  Bone density:  Lipid panel:  No Known Allergies  Current Outpatient Medications  Medication Sig Dispense Refill  . ferrous sulfate 325 (65 FE) MG tablet TAKE 1 TABLET (325 MG TOTAL) 2 (TWO) TIMES DAILY WITH A MEAL BY MOUTH. 180 tablet 0  . ibuprofen (ADVIL,MOTRIN) 200 MG tablet Take 200 mg by mouth every 6 (six) hours as needed for headache or moderate pain.    . megestrol (MEGACE) 40 MG tablet Take 1 tablet (40 mg total) by mouth daily. 30 tablet 11  . norethindrone (AYGESTIN) 5 MG tablet Take 5 mg by mouth daily.  11   No current facility-administered medications for this visit.     OBJECTIVE: Vitals:   03/17/17 1546  BP: 109/70  Pulse: 88  Resp: 18  Temp: 98.3 F (36.8 C)  SpO2: 100%    Constitutional: Well-developed, well-nourished, and in no distress.   HENT:  Head: Normocephalic and atraumatic.  Mouth/Throat: No oropharyngeal exudate. Mucosa moist. Eyes: Pupils are equal, round, and reactive to light. Conjunctivae are normal. No scleral icterus.  Neck: Normal range of motion. Neck supple.  No JVD present.  Cardiovascular: Normal rate, regular rhythm and normal heart sounds.  Exam reveals no gallop and no friction rub.   No murmur heard. Pulmonary/Chest: Effort normal and breath sounds normal. No respiratory distress. No wheezes.No rales.  Abdominal: Soft. Bowel sounds are normal. No distension. There is no tenderness. There is no guarding.  Musculoskeletal: No edema or tenderness.  Lymphadenopathy:    No cervical or supraclavicular adenopathy.  Neurological: Alert and oriented to person, place, and time. No cranial nerve deficit.  Skin: Skin is warm and dry. No  rash noted. No erythema. No pallor.  Psychiatric: Affect and judgment normal.    Vitals:   03/17/17 1546  BP: 109/70  Pulse: 88  Resp: 18  Temp: 98.3 F (36.8 C)  SpO2: 100%     Body mass index is 35.94 kg/m.    ECOG FS:0 - Asymptomatic  LAB RESULTS:  No visits with results within 5 Day(s) from this visit.  Latest known visit with results is:  Appointment on 02/17/2017  Component Date Value Ref Range Status  . WBC 02/17/2017 6.7  4.0 - 10.5 K/uL Final  . RBC 02/17/2017 3.33* 3.87 - 5.11 MIL/uL Final  . Hemoglobin 02/17/2017 10.1* 12.0 - 15.0 g/dL Final  . HCT 02/17/2017 32.3* 36.0 - 46.0 % Final  . MCV 02/17/2017 97.0  78.0 - 100.0 fL Final  . MCH 02/17/2017 30.3  26.0 - 34.0 pg Final  . MCHC 02/17/2017 31.3  30.0 - 36.0 g/dL Final  . RDW 02/17/2017 13.0  11.5 - 15.5 % Final  . Platelets 02/17/2017 524* 150 - 400 K/uL Final  . Neutrophils Relative % 02/17/2017 67  % Final  . Neutro Abs 02/17/2017 4.6  1.7 - 7.7 K/uL Final  . Lymphocytes Relative 02/17/2017 28  % Final  . Lymphs Abs 02/17/2017 1.9  0.7 - 4.0 K/uL Final  . Monocytes Relative 02/17/2017 4  % Final  . Monocytes Absolute 02/17/2017 0.2  0.1 - 1.0 K/uL Final  . Eosinophils Relative 02/17/2017 1  % Final  . Eosinophils Absolute 02/17/2017 0.0  0.0 - 0.7 K/uL Final  . Basophils Relative 02/17/2017 0  % Final  . Basophils Absolute 02/17/2017 0.0  0.0 - 0.1 K/uL Final  . Sodium 02/17/2017 138  135 - 145 mmol/L Final  . Potassium 02/17/2017 3.6  3.5 - 5.1 mmol/L Final  . Chloride 02/17/2017 107  101 - 111 mmol/L Final  . CO2 02/17/2017 22  22 - 32 mmol/L Final  . Glucose, Bld 02/17/2017 89  65 - 99 mg/dL Final  . BUN 02/17/2017 8  6 - 20 mg/dL Final  . Creatinine, Ser 02/17/2017 0.82  0.44 - 1.00 mg/dL Final  . Calcium 02/17/2017 8.9  8.9 - 10.3 mg/dL Final  . Total Protein 02/17/2017 7.7  6.5 - 8.1 g/dL Final  . Albumin 02/17/2017 4.1  3.5 - 5.0 g/dL Final  . AST 02/17/2017 20  15 - 41 U/L Final  . ALT  02/17/2017 17  14 - 54 U/L Final  . Alkaline Phosphatase 02/17/2017 63  38 - 126 U/L Final  . Total Bilirubin 02/17/2017 0.8  0.3 - 1.2 mg/dL Final  . GFR calc non Af Amer 02/17/2017 >60  >60 mL/min Final  . GFR calc Af Amer 02/17/2017 >60  >60 mL/min Final   Comment: (NOTE) The eGFR has been calculated using the CKD EPI equation. This calculation has not been validated in all clinical situations. eGFR's persistently <60 mL/min signify possible Chronic  Kidney Disease.   . Anion gap 02/17/2017 9  5 - 15 Final  . Ferritin 02/17/2017 72  11 - 307 ng/mL Final   Performed at [facility], [place] [address], [place], [place] [address]  . Iron 02/17/2017 59  28 - 170 ug/dL Final  . TIBC 02/17/2017 315  250 - 450 ug/dL Final  . Saturation Ratios 02/17/2017 19  10.4 - 31.8 % Final  . UIBC 02/17/2017 256  ug/dL Final   Performed at [facility], [place] [address], [place], [place] [address]     STUDIES: All lab data has been reviewed from outside facility ASSESSMENT:  Iron deficiency anemia likely due to menorrhalgia  Reactive thrombocytosis due to iron deficiency anemia  Patient is a Jehovah's Witness  PLAN:   - Patient is to have uterine ablation done on 03/27/2017. Will be having preop labs drawn on Monday. Will try to have them add on ferritin level if possible. - Patient has had a great response to IV iron in the past and most recent IV Feraheme was on 03/03/2017. If the uterine ablation is successful patient may need a few more doses of IV iron post ablation. - Patient has a history of thrombocytosis. Last platelet on record from 02/17/2017 was 524.  -Have tentatively set her up for labs and to see an M.D. in approximately 1 month after uterine ablation for further treatment planning and possible IV Feraheme.   No orders of the defined types were placed in this encounter.   Jacquelin Hawking, NP   03/17/2017 4:17 PM

## 2017-03-17 NOTE — Patient Instructions (Signed)
[facility] at [facility]  Discharge Instructions:  You were seen by Jennifer Burns, NP.  _______________________________________________________________  Thank you for choosing [facility] at [facility] to provide your oncology and hematology care.  To afford each patient quality time with our providers, please arrive at least 15 minutes before your scheduled appointment.  You need to re-schedule your appointment if you arrive 10 or more minutes late.  We strive to give you quality time with our providers, and arriving late affects you and other patients whose appointments are after yours.  Also, if you no show three or more times for appointments you may be dismissed from the clinic.  Again, thank you for choosing [facility]. Our hope is that these requests will allow you access to exceptional care and in a timely manner. _______________________________________________________________  If you have questions after your visit, please contact our office at [phone] between the hours of 8:30 a.m. and 5:00 p.m. Voicemails left after 4:30 p.m. will not be returned until the following business day. _______________________________________________________________  For prescription refill requests, have your pharmacy contact our office. _______________________________________________________________  Recommendations made by the consultant and any test results will be sent to your referring physician. _______________________________________________________________ 

## 2017-03-27 DIAGNOSIS — D250 Submucous leiomyoma of uterus: Secondary | ICD-10-CM | POA: Diagnosis not present

## 2017-03-27 DIAGNOSIS — Z6836 Body mass index (BMI) 36.0-36.9, adult: Secondary | ICD-10-CM | POA: Diagnosis not present

## 2017-03-27 DIAGNOSIS — E669 Obesity, unspecified: Secondary | ICD-10-CM | POA: Diagnosis not present

## 2017-03-27 DIAGNOSIS — D252 Subserosal leiomyoma of uterus: Secondary | ICD-10-CM | POA: Diagnosis not present

## 2017-03-27 DIAGNOSIS — D500 Iron deficiency anemia secondary to blood loss (chronic): Secondary | ICD-10-CM | POA: Diagnosis not present

## 2017-03-27 DIAGNOSIS — N920 Excessive and frequent menstruation with regular cycle: Secondary | ICD-10-CM | POA: Diagnosis not present

## 2017-04-07 DIAGNOSIS — L853 Xerosis cutis: Secondary | ICD-10-CM | POA: Diagnosis not present

## 2017-04-07 DIAGNOSIS — D485 Neoplasm of uncertain behavior of skin: Secondary | ICD-10-CM | POA: Diagnosis not present

## 2017-04-18 ENCOUNTER — Other Ambulatory Visit (HOSPITAL_COMMUNITY): Payer: Self-pay

## 2017-04-18 DIAGNOSIS — D473 Essential (hemorrhagic) thrombocythemia: Secondary | ICD-10-CM

## 2017-04-18 DIAGNOSIS — D509 Iron deficiency anemia, unspecified: Secondary | ICD-10-CM

## 2017-04-18 DIAGNOSIS — D75839 Thrombocytosis, unspecified: Secondary | ICD-10-CM

## 2017-04-21 ENCOUNTER — Inpatient Hospital Stay (HOSPITAL_COMMUNITY): Payer: BLUE CROSS/BLUE SHIELD

## 2017-04-21 ENCOUNTER — Inpatient Hospital Stay (HOSPITAL_COMMUNITY): Payer: BLUE CROSS/BLUE SHIELD | Attending: Oncology | Admitting: Internal Medicine

## 2017-04-21 VITALS — BP 102/65 | HR 80 | Temp 98.1°F | Resp 16 | Wt 215.9 lb

## 2017-04-21 DIAGNOSIS — L853 Xerosis cutis: Secondary | ICD-10-CM | POA: Diagnosis not present

## 2017-04-21 DIAGNOSIS — Z79899 Other long term (current) drug therapy: Secondary | ICD-10-CM | POA: Diagnosis not present

## 2017-04-21 DIAGNOSIS — N920 Excessive and frequent menstruation with regular cycle: Secondary | ICD-10-CM

## 2017-04-21 DIAGNOSIS — Z801 Family history of malignant neoplasm of trachea, bronchus and lung: Secondary | ICD-10-CM

## 2017-04-21 DIAGNOSIS — D75839 Thrombocytosis, unspecified: Secondary | ICD-10-CM

## 2017-04-21 DIAGNOSIS — D473 Essential (hemorrhagic) thrombocythemia: Secondary | ICD-10-CM | POA: Diagnosis not present

## 2017-04-21 DIAGNOSIS — D509 Iron deficiency anemia, unspecified: Secondary | ICD-10-CM

## 2017-04-21 DIAGNOSIS — D485 Neoplasm of uncertain behavior of skin: Secondary | ICD-10-CM | POA: Diagnosis not present

## 2017-04-21 DIAGNOSIS — L820 Inflamed seborrheic keratosis: Secondary | ICD-10-CM | POA: Diagnosis not present

## 2017-04-21 LAB — CBC WITH DIFFERENTIAL/PLATELET
Basophils Absolute: 0 10*3/uL (ref 0.0–0.1)
Basophils Relative: 1 %
EOS ABS: 0 10*3/uL (ref 0.0–0.7)
Eosinophils Relative: 0 %
HEMATOCRIT: 38 % (ref 36.0–46.0)
HEMOGLOBIN: 12.6 g/dL (ref 12.0–15.0)
LYMPHS ABS: 2.2 10*3/uL (ref 0.7–4.0)
Lymphocytes Relative: 37 %
MCH: 29.8 pg (ref 26.0–34.0)
MCHC: 33.2 g/dL (ref 30.0–36.0)
MCV: 89.8 fL (ref 78.0–100.0)
MONO ABS: 0.3 10*3/uL (ref 0.1–1.0)
Monocytes Relative: 5 %
NEUTROS ABS: 3.3 10*3/uL (ref 1.7–7.7)
NEUTROS PCT: 57 %
Platelets: 428 10*3/uL — ABNORMAL HIGH (ref 150–400)
RBC: 4.23 MIL/uL (ref 3.87–5.11)
RDW: 12 % (ref 11.5–15.5)
WBC: 5.8 10*3/uL (ref 4.0–10.5)

## 2017-04-21 LAB — COMPREHENSIVE METABOLIC PANEL
ALK PHOS: 79 U/L (ref 38–126)
ALT: 25 U/L (ref 14–54)
ANION GAP: 11 (ref 5–15)
AST: 19 U/L (ref 15–41)
Albumin: 4 g/dL (ref 3.5–5.0)
BILIRUBIN TOTAL: 0.9 mg/dL (ref 0.3–1.2)
BUN: 9 mg/dL (ref 6–20)
CALCIUM: 9.1 mg/dL (ref 8.9–10.3)
CO2: 22 mmol/L (ref 22–32)
Chloride: 103 mmol/L (ref 101–111)
Creatinine, Ser: 0.8 mg/dL (ref 0.44–1.00)
GFR calc non Af Amer: >60 mL/min (ref >60)
Glucose, Bld: 80 mg/dL (ref 65–99)
Potassium: 3.9 mmol/L (ref 3.5–5.1)
Sodium: 136 mmol/L (ref 135–145)
TOTAL PROTEIN: 7.5 g/dL (ref 6.5–8.1)

## 2017-04-21 LAB — IRON AND TIBC
Iron: 114 ug/dL (ref 28–170)
SATURATION RATIOS: 35 % — AB (ref 10.4–31.8)
TIBC: 325 ug/dL (ref 250–450)
UIBC: 211 ug/dL

## 2017-04-21 LAB — FERRITIN: FERRITIN: 129 ng/mL (ref 11–307)

## 2017-04-21 NOTE — Patient Instructions (Signed)
[place] at [facility] Discharge Instructions   You were seen today by Dr. Zoila Shutter. She discussed how you have been feeling since your last infusion. She went over your recent lab work and everything looks good. We will see you back in 4 months for labs and follow up.   Thank you for choosing [place] at [facility] to provide your oncology and hematology care.  To afford each patient quality time with our provider, please arrive at least 15 minutes before your scheduled appointment time.    If you have a lab appointment with the [facility] please come in thru the  Main Entrance and check in at the main information desk  You need to re-schedule your appointment should you arrive 10 or more minutes late.  We strive to give you quality time with our providers, and arriving late affects you and other patients whose appointments are after yours.  Also, if you no show three or more times for appointments you may be dismissed from the clinic at the providers discretion.     Again, thank you for choosing [facility].  Our hope is that these requests will decrease the amount of time that you wait before being seen by our physicians.       _____________________________________________________________  Should you have questions after your visit to [facility], please contact our office at [phone] between the hours of 8:30 a.m. and 4:30 p.m.  Voicemails left after 4:30 p.m. will not be returned until the following business day.  For prescription refill requests, have your pharmacy contact our office.       Resources For Cancer Patients and their Caregivers ? American Cancer Society: Can assist with transportation, wigs, general needs, runs Look Good Feel Better.        (269)233-8467 ? Cancer Care: Provides financial assistance, online support groups, medication/co-pay assistance.  1-800-813-HOPE  615-329-1193) ? [employer] Assists [place] cancer patients and their families through emotional , educational and financial support.  820-336-7047 ? [place] DSS Where to apply for food stamps, Medicaid and utility assistance. 520 287 2128 ? [employer]: Transportation to medical appointments. 770-036-2397 ? Social Security Administration: May apply for disability if have a Stage IV cancer. (717) 848-9866 613-121-3608 ? [employer], Disability and Transit Services: Assists with nutrition, care and transit needs. [place] Support Programs:   > Cancer Support Group  2nd Tuesday of the month 1pm-2pm, Journey Room   > Creative Journey  3rd Tuesday of the month 1130am-1pm, Journey Room

## 2017-04-21 NOTE — Progress Notes (Signed)
Diagnosis Microcytic anemia - Plan: CBC with Differential/Platelet, Comprehensive metabolic panel, Lactate dehydrogenase, Ferritin  Staging Cancer Staging No matching staging information was found for the patient.  Assessment and Plan: 1.  Iron deficiency anemia likely due to menorrhalgia.  Pt was treated with IV iron on February 2019.  Labs today hemoglobin 12.6.  She has undergone uterine ablation on 03/27/2017.  She will return to clinic in 07/2017  for follow-up and repeat labs.  2.  Menorrhagia.  Patient reports he has not had a cycle since her ablation that was done on 03/27/2017.  She should continue to follow-up with GYN as recommended.  3.  Thrombocytosis.  This was likely reactive due to iron deficiency.  Platelet count is improved at 428,000.  4.  Health maintenance.  Patient reports she has undergone mammogram evaluation in the past.   Interval History: 42 year old female with iron deficiency anemia and menorrhagia.  She has undergone uterine ablation and has been treated with IV iron.  Current Status: Patient is seen today for follow-up.  She is here today to go over lab studies.  When questioned she reports she has not had a cycle since she has undergone her uterine ablation.  Problem List Patient Active Problem List   Diagnosis Date Noted  . Microcytic anemia [D50.9] 10/22/2016  . Thrombocytosis ([place]) [D47.3] 05/30/2015  . 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 Ankle swelling [M25.473] 05/29/2015    Past Medical History Past Medical History:  Diagnosis Date  . Anemia   . HSV-1 (herpes simplex virus 1) infection     Past Surgical History Past Surgical History:  Procedure Laterality Date  . ANTERIOR CRUCIATE LIGAMENT REPAIR Left 2001    Family History Family History  Problem Relation Age of Onset  . Cancer Father        lung  . Schizophrenia Brother   . Diabetes Brother   . Hypertension Brother   . Cancer Maternal Grandmother       Social History  reports that she has never smoked. She has never used smokeless tobacco. She reports that she drinks alcohol. She reports that she does not use drugs.  Medications  Current Outpatient Medications:  .  ferrous sulfate 325 (65 FE) MG tablet, TAKE 1 TABLET (325 MG TOTAL) 2 (TWO) TIMES DAILY WITH A MEAL BY MOUTH., Disp: 180 tablet, Rfl: 0 .  ibuprofen (ADVIL,MOTRIN) 200 MG tablet, Take 200 mg by mouth every 6 (six) hours as needed for headache or moderate pain., Disp: , Rfl:   Allergies Patient has no known allergies.  Review of Systems Review of Systems - Oncology ROS as per HPI otherwise 12 point ROS is negative.   Physical Exam  Vitals Wt Readings from Last 3 Encounters:  04/21/17 215 lb 14.4 oz (97.9 kg)  03/17/17 216 lb (98 kg)  11/25/16 225 lb 3.2 oz (102.2 kg)   Temp Readings from Last 3 Encounters:  04/21/17 98.1 F (36.7 C)  03/17/17 98.3 F (36.8 C) (Oral)  03/03/17 98 F (36.7 C) (Oral)   BP Readings from Last 3 Encounters:  04/21/17 102/65  03/17/17 109/70  03/03/17 121/68   Pulse Readings from Last 3 Encounters:  04/21/17 80  03/17/17 88  03/03/17 80   Constitutional: Well-developed, well-nourished, and in no distress.   HENT: Head: Normocephalic and atraumatic.  Mouth/Throat: No oropharyngeal exudate. Mucosa moist. Eyes: Pupils are equal, round, and reactive to light. Conjunctivae are normal. No scleral icterus.  Neck: Normal range of  motion. Neck supple. No JVD present.  Cardiovascular: Normal rate, regular rhythm and normal heart sounds.  Exam reveals no gallop and no friction rub.   No murmur heard. Pulmonary/Chest: Effort normal and breath sounds normal. No respiratory distress. No wheezes.No rales.  Abdominal: Soft. Bowel sounds are normal. No distension. There is no tenderness. There is no guarding.  Musculoskeletal: No edema or tenderness.  Lymphadenopathy: No cervical, axillary or supraclavicular adenopathy.  Neurological:  Alert and oriented to person, place, and time. No cranial nerve deficit.  Skin: Skin is warm and dry. No rash noted. No erythema. No pallor.  Psychiatric: Affect and judgment normal.   Labs Appointment on 04/21/2017  Component Date Value Ref Range Status  . WBC 04/21/2017 5.8  4.0 - 10.5 K/uL Final  . RBC 04/21/2017 4.23  3.87 - 5.11 MIL/uL Final  . Hemoglobin 04/21/2017 12.6  12.0 - 15.0 g/dL Final  . HCT 04/21/2017 38.0  36.0 - 46.0 % Final  . MCV 04/21/2017 89.8  78.0 - 100.0 fL Final  . MCH 04/21/2017 29.8  26.0 - 34.0 pg Final  . MCHC 04/21/2017 33.2  30.0 - 36.0 g/dL Final  . RDW 04/21/2017 12.0  11.5 - 15.5 % Final  . Platelets 04/21/2017 428* 150 - 400 K/uL Final  . Neutrophils Relative % 04/21/2017 57  % Final  . Neutro Abs 04/21/2017 3.3  1.7 - 7.7 K/uL Final  . Lymphocytes Relative 04/21/2017 37  % Final  . Lymphs Abs 04/21/2017 2.2  0.7 - 4.0 K/uL Final  . Monocytes Relative 04/21/2017 5  % Final  . Monocytes Absolute 04/21/2017 0.3  0.1 - 1.0 K/uL Final  . Eosinophils Relative 04/21/2017 0  % Final  . Eosinophils Absolute 04/21/2017 0.0  0.0 - 0.7 K/uL Final  . Basophils Relative 04/21/2017 1  % Final  . Basophils Absolute 04/21/2017 0.0  0.0 - 0.1 K/uL Final   Performed at [facility], [address] [place], [place] [address]  . Sodium 04/21/2017 136  135 - 145 mmol/L Final  . Potassium 04/21/2017 3.9  3.5 - 5.1 mmol/L Final  . Chloride 04/21/2017 103  101 - 111 mmol/L Final  . CO2 04/21/2017 22  22 - 32 mmol/L Final  . Glucose, Bld 04/21/2017 80  65 - 99 mg/dL Final  . BUN 04/21/2017 9  6 - 20 mg/dL Final  . Creatinine, Ser 04/21/2017 0.80  0.44 - 1.00 mg/dL Final  . Calcium 04/21/2017 9.1  8.9 - 10.3 mg/dL Final  . Total Protein 04/21/2017 7.5  6.5 - 8.1 g/dL Final  . Albumin 04/21/2017 4.0  3.5 - 5.0 g/dL Final  . AST 04/21/2017 19  15 - 41 U/L Final  . ALT 04/21/2017 25  14 - 54 U/L Final  . Alkaline Phosphatase 04/21/2017 79  38 - 126 U/L Final  . Total  Bilirubin 04/21/2017 0.9  0.3 - 1.2 mg/dL Final  . GFR calc non Af Amer 04/21/2017 >60  >60 mL/min Final  . GFR calc Af Amer 04/21/2017 >60  >60 mL/min Final   Comment: (NOTE) The eGFR has been calculated using the CKD EPI equation. This calculation has not been validated in all clinical situations. eGFR's persistently <60 mL/min signify possible Chronic Kidney Disease.   Georgiann Hahn gap 04/21/2017 11  5 - 15 Final   Performed at [facility], [address] [place], [place] [address]     Pathology Orders Placed This Encounter  Procedures  . CBC with Differential/Platelet  Standing Status:   Future    Standing Expiration Date:   04/22/2018  . Comprehensive metabolic panel    Standing Status:   Future    Standing Expiration Date:   04/22/2018  . Lactate dehydrogenase    Standing Status:   Future    Standing Expiration Date:   04/22/2018  . Ferritin    Standing Status:   Future    Standing Expiration Date:   04/22/2018       Zoila Shutter MD

## 2017-04-22 ENCOUNTER — Encounter (HOSPITAL_COMMUNITY): Payer: Self-pay | Admitting: Internal Medicine

## 2017-04-22 ENCOUNTER — Other Ambulatory Visit: Payer: Self-pay

## 2017-05-26 ENCOUNTER — Ambulatory Visit (INDEPENDENT_AMBULATORY_CARE_PROVIDER_SITE_OTHER): Payer: BLUE CROSS/BLUE SHIELD

## 2017-05-26 ENCOUNTER — Encounter: Payer: Self-pay | Admitting: Physician Assistant

## 2017-05-26 ENCOUNTER — Ambulatory Visit (INDEPENDENT_AMBULATORY_CARE_PROVIDER_SITE_OTHER): Payer: BLUE CROSS/BLUE SHIELD | Admitting: Physician Assistant

## 2017-05-26 VITALS — BP 104/66 | HR 73 | Temp 97.7°F | Ht 65.0 in | Wt 217.0 lb

## 2017-05-26 DIAGNOSIS — D259 Leiomyoma of uterus, unspecified: Secondary | ICD-10-CM | POA: Diagnosis not present

## 2017-05-26 DIAGNOSIS — M5432 Sciatica, left side: Secondary | ICD-10-CM | POA: Diagnosis not present

## 2017-05-26 NOTE — Progress Notes (Signed)
BP 104/66   Pulse 73   Temp 97.7 F (36.5 C) (Oral)   Ht 5\' 5"  (1.651 m)   Wt 217 lb (98.4 kg)   BMI 36.11 kg/m    Subjective:    Patient ID: Janice Galvan, female    DOB: 11-09-1975, 42 y.o.   MRN: [id number]  HPI: Janice Galvan is a 42 y.o. female presenting on 05/26/2017 for back pain with left radiation.  Patient comes in with low back pain that is been going on for about 4 days.  2 days ago it got much worse.  There is some radiculopathy now on the left side.  She is pointing to this area and showing that it hurts in the SI area and down the lateral portion of the leg.  She denies any weakness.  Sitting position is most bothersome.  When she stands it is also bothersome.  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  She is trying to work on getting up and doing more stretches.  It is of note that she has had an ablation in February.  The next.  After that she had low pelvic pain.  She had never had this before with her cycle.  She does have fibroids in her uterus and developed anemia related to dysfunctional uterine bleeding.  She did start her cycle today.  She was concerned that there was a connection between her back pain and what seems to be the new normal for her menstrual cycles.  Past Medical History:  Diagnosis Date  . Anemia   . HSV-1 (herpes simplex virus 1) infection    Relevant past medical, surgical, family and social history reviewed and updated as indicated. Interim medical history since our last visit reviewed. Allergies and medications reviewed and updated. DATA REVIEWED: CHART IN EPIC  Family History reviewed for pertinent findings.  Review of Systems  Constitutional: Negative.   HENT: Negative.   Eyes: Negative.   Respiratory: Negative.   Gastrointestinal: Negative.   Genitourinary: Negative.   Musculoskeletal: Positive for back pain and myalgias. Negative for joint swelling.    Allergies as of 05/26/2017   No Known  Allergies     Medication List        Accurate as of 05/26/17  1:36 PM. Always use your most recent med list.          ferrous sulfate 325 (65 FE) MG tablet TAKE 1 TABLET (325 MG TOTAL) 2 (TWO) TIMES DAILY WITH A MEAL BY MOUTH.   ibuprofen 200 MG tablet Commonly known as:  ADVIL,MOTRIN Take 200 mg by mouth every 6 (six) hours as needed for headache or moderate pain.          Objective:    BP 104/66   Pulse 73   Temp 97.7 F (36.5 C) (Oral)   Ht 5\' 5"  (1.651 m)   Wt 217 lb (98.4 kg)   BMI 36.11 kg/m   No Known Allergies  Wt Readings from Last 3 Encounters:  05/26/17 217 lb (98.4 kg)  04/21/17 215 lb 14.4 oz (97.9 kg)  03/17/17 216 lb (98 kg)    Physical Exam  Constitutional: She is oriented to person, place, and time. She appears well-developed and well-nourished.  HENT:  Head: Normocephalic and atraumatic.  Eyes: Pupils are equal, round, and reactive to light. Conjunctivae and EOM are normal.  Cardiovascular: Normal rate, regular rhythm, normal heart sounds and intact distal pulses.  Pulmonary/Chest:  Effort normal and breath sounds normal.  Abdominal: Soft. Bowel sounds are normal.  Musculoskeletal:       Lumbar back: She exhibits tenderness and pain. She exhibits no swelling and no edema.       Back:  Neurological: She is alert and oriented to person, place, and time.  Reflex Scores:      Patellar reflexes are 0 on the right side and 0 on the left side. Neg straight leg raise  Skin: Skin is warm and dry. No rash noted.  Psychiatric: She has a normal mood and affect. Her behavior is normal. Judgment and thought content normal.        Assessment & Plan:   1. Sciatica of left side - DG Sacrum/Coccyx; Future Ibuprofen 800 TID this week Sciatica exercises  2. Uterine leiomyoma, unspecified location Keep follow up with GYN  Continue all other maintenance medications as listed above.  Follow up plan: No follow-ups on file.  Educational handout given  for [place] PA-C [place] [address]  [place], [place] [address] [phone]   05/26/2017, 1:36 PM

## 2017-05-26 NOTE — Patient Instructions (Addendum)
Sciatica Rehab  Ask your health care provider which exercises are safe for you. Do exercises exactly as told by your health care provider and adjust them as directed. It is normal to feel mild stretching, pulling, tightness, or discomfort as you do these exercises, but you should stop right away if you feel sudden pain or your pain gets worse. Do not begin these exercises until told by your health care provider.  Stretching and range of motion exercises  These exercises warm up your muscles and joints and improve the movement and flexibility of your hips and your back. These exercises also help to relieve pain, numbness, and tingling.  Exercise A: Sciatic nerve glide  1. Sit in a chair with your head facing down toward your chest. Place your hands behind your back. Let your shoulders slump forward.  2. Slowly straighten one of your knees while you tilt your head back as if you are looking toward the ceiling. Only straighten your leg as far as you can without making your symptoms worse.  3. Hold for __________ seconds.  4. Slowly return to the starting position.  5. Repeat with your other leg.  Repeat __________ times. Complete this exercise __________ times a day.  Exercise B: Knee to chest with hip adduction and internal rotation    1. Lie on your back on a firm surface with both legs straight.  2. Bend one of your knees and move it up toward your chest until you feel a gentle stretch in your lower back and buttock. Then, move your knee toward the shoulder that is on the opposite side from your leg.  ? Hold your leg in this position by holding onto the front of your knee.  3. Hold for __________ seconds.  4. Slowly return to the starting position.  5. Repeat with your other leg.  Repeat __________ times. Complete this exercise __________ times a day.  Exercise C: Prone extension on elbows    1. Lie on your abdomen on a firm surface. A bed may be too soft for this exercise.  2. Prop yourself up on your  elbows.  3. Use your arms to help lift your chest up until you feel a gentle stretch in your abdomen and your lower back.  ? This will place some of your body weight on your elbows. If this is uncomfortable, try stacking pillows under your chest.  ? Your hips should stay down, against the surface that you are lying on. Keep your hip and back muscles relaxed.  4. Hold for __________ seconds.  5. Slowly relax your upper body and return to the starting position.  Repeat __________ times. Complete this exercise __________ times a day.  Strengthening exercises  These exercises build strength and endurance in your back. Endurance is the ability to use your muscles for a long time, even after they get tired.  Exercise D: Pelvic tilt  1. Lie on your back on a firm surface. Bend your knees and keep your feet flat.  2. Tense your abdominal muscles. Tip your pelvis up toward the ceiling and flatten your lower back into the floor.  ? To help with this exercise, you may place a small towel under your lower back and try to push your back into the towel.  3. Hold for __________ seconds.  4. Let your muscles relax completely before you repeat this exercise.  Repeat __________ times. Complete this exercise __________ times a day.  Exercise E: Alternating arm and leg raises      1. Get on your hands and knees on a firm surface. If you are on a hard floor, you may want to use padding to cushion your knees, such as an exercise mat.  2. Line up your arms and legs. Your hands should be below your shoulders, and your knees should be below your hips.  3. Lift your left leg behind you. At the same time, raise your right arm and straighten it in front of you.  ? Do not lift your leg higher than your hip.  ? Do not lift your arm higher than your shoulder.  ? Keep your abdominal and back muscles tight.  ? Keep your hips facing the ground.  ? Do not arch your back.  ? Keep your balance carefully, and do not hold your breath.  4. Hold for  __________ seconds.  5. Slowly return to the starting position and repeat with your right leg and your left arm.  Repeat __________ times. Complete this exercise __________ times a day.  Posture and body mechanics    Body mechanics refers to the movements and positions of your body while you do your daily activities. Posture is part of body mechanics. Good posture and healthy body mechanics can help to relieve stress in your body's tissues and joints. Good posture means that your spine is in its natural S-curve position (your spine is neutral), your shoulders are pulled back slightly, and your head is not tipped forward. The following are general guidelines for applying improved posture and body mechanics to your everyday activities.  Standing    · When standing, keep your spine neutral and your feet about hip-width apart. Keep a slight bend in your knees. Your ears, shoulders, and hips should line up.  · When you do a task in which you stand in one place for a long time, place one foot up on a stable object that is 2-4 inches (5-10 cm) high, such as a footstool. This helps keep your spine neutral.  Sitting    · When sitting, keep your spine neutral and keep your feet flat on the floor. Use a footrest, if necessary, and keep your thighs parallel to the floor. Avoid rounding your shoulders, and avoid tilting your head forward.  · When working at a desk or a computer, keep your desk at a height where your hands are slightly lower than your elbows. Slide your chair under your desk so you are close enough to maintain good posture.  · When working at a computer, place your monitor at a height where you are looking straight ahead and you do not have to tilt your head forward or downward to look at the screen.  Resting    · When lying down and resting, avoid positions that are most painful for you.  · If you have pain with activities such as sitting, bending, stooping, or squatting (flexion-based activities), lie in a  position in which your body does not bend very much. For example, avoid curling up on your side with your arms and knees near your chest (fetal position).  · If you have pain with activities such as standing for a long time or reaching with your arms (extension-based activities), lie with your spine in a neutral position and bend your knees slightly. Try the following positions:  ? Lying on your side with a pillow between your knees.  ? Lying on your back with a pillow under your knees.  Lifting    · When lifting   objects, keep your feet at least shoulder-width apart and tighten your abdominal muscles.  · Bend your knees and hips and keep your spine neutral. It is important to lift using the strength of your legs, not your back. Do not lock your knees straight out.  · Always ask for help to lift heavy or awkward objects.  This information is not intended to replace advice given to you by your health care provider. Make sure you discuss any questions you have with your health care provider.  Document Released: 01/14/2005 Document Revised: 09/21/2015 Document Reviewed: 09/30/2014  Elsevier Interactive Patient Education © 2018 [employer]

## 2017-06-02 ENCOUNTER — Ambulatory Visit: Payer: BLUE CROSS/BLUE SHIELD | Admitting: Family Medicine

## 2017-06-04 ENCOUNTER — Telehealth: Payer: Self-pay

## 2017-06-04 NOTE — Telephone Encounter (Signed)
Patient wants to get an MRI

## 2017-06-05 NOTE — Telephone Encounter (Signed)
Patient aware- sending to provider.  Also wanting to know if she comes back in for a 2 week follow up will insurance cover her MRI at that point or will she have to come back in again? Please advise

## 2017-06-05 NOTE — Telephone Encounter (Signed)
Will send this to the treating provider as well but pt will likely need to follow up to document lack of response to therapy.   Laroy Apple, MD [facility] 06/05/2017, 7:37 AM

## 2017-06-06 ENCOUNTER — Other Ambulatory Visit: Payer: Self-pay | Admitting: *Deleted

## 2017-06-06 DIAGNOSIS — G8911 Acute pain due to trauma: Secondary | ICD-10-CM

## 2017-06-06 NOTE — Telephone Encounter (Signed)
Okay to order MRI lumbar  Dx: lumbar pain and sciatica left

## 2017-06-06 NOTE — Telephone Encounter (Signed)
Patient had xray done on 05/26/17

## 2017-06-06 NOTE — Telephone Encounter (Signed)
Patient aware Mri has been ordered

## 2017-06-06 NOTE — Telephone Encounter (Signed)
She should come in and can have xray performed here as next line and then hopefully MRI

## 2017-06-16 ENCOUNTER — Ambulatory Visit (HOSPITAL_COMMUNITY)
Admission: RE | Admit: 2017-06-16 | Discharge: 2017-06-16 | Disposition: A | Discharge status: Home / Self Care | Payer: BLUE CROSS/BLUE SHIELD | Source: Ambulatory Visit | Attending: Physician Assistant | Admitting: Physician Assistant

## 2017-06-16 ENCOUNTER — Ambulatory Visit (HOSPITAL_COMMUNITY): Payer: BLUE CROSS/BLUE SHIELD

## 2017-06-16 DIAGNOSIS — G8911 Acute pain due to trauma: Secondary | ICD-10-CM | POA: Diagnosis not present

## 2017-06-16 DIAGNOSIS — D649 Anemia, unspecified: Secondary | ICD-10-CM | POA: Diagnosis not present

## 2017-06-16 DIAGNOSIS — M545 Low back pain: Secondary | ICD-10-CM | POA: Diagnosis not present

## 2017-06-17 ENCOUNTER — Ambulatory Visit (HOSPITAL_COMMUNITY): Payer: BLUE CROSS/BLUE SHIELD

## 2017-06-30 DIAGNOSIS — M5442 Lumbago with sciatica, left side: Secondary | ICD-10-CM | POA: Diagnosis not present

## 2017-06-30 DIAGNOSIS — D252 Subserosal leiomyoma of uterus: Secondary | ICD-10-CM | POA: Diagnosis not present

## 2017-06-30 DIAGNOSIS — D250 Submucous leiomyoma of uterus: Secondary | ICD-10-CM | POA: Diagnosis not present

## 2017-06-30 DIAGNOSIS — Z789 Other specified health status: Secondary | ICD-10-CM | POA: Diagnosis not present

## 2017-08-25 ENCOUNTER — Inpatient Hospital Stay (HOSPITAL_COMMUNITY): Payer: BLUE CROSS/BLUE SHIELD | Attending: Hematology

## 2017-08-25 ENCOUNTER — Inpatient Hospital Stay (HOSPITAL_BASED_OUTPATIENT_CLINIC_OR_DEPARTMENT_OTHER): Payer: BLUE CROSS/BLUE SHIELD | Admitting: Internal Medicine

## 2017-08-25 ENCOUNTER — Encounter (HOSPITAL_COMMUNITY): Payer: Self-pay | Admitting: Internal Medicine

## 2017-08-25 ENCOUNTER — Other Ambulatory Visit: Payer: Self-pay

## 2017-08-25 VITALS — BP 111/80 | HR 74 | Temp 98.6°F | Resp 20 | Wt 205.1 lb

## 2017-08-25 DIAGNOSIS — D509 Iron deficiency anemia, unspecified: Secondary | ICD-10-CM

## 2017-08-25 DIAGNOSIS — N920 Excessive and frequent menstruation with regular cycle: Secondary | ICD-10-CM | POA: Diagnosis not present

## 2017-08-25 DIAGNOSIS — D473 Essential (hemorrhagic) thrombocythemia: Secondary | ICD-10-CM | POA: Diagnosis not present

## 2017-08-25 DIAGNOSIS — D500 Iron deficiency anemia secondary to blood loss (chronic): Secondary | ICD-10-CM | POA: Diagnosis not present

## 2017-08-25 DIAGNOSIS — Z801 Family history of malignant neoplasm of trachea, bronchus and lung: Secondary | ICD-10-CM | POA: Diagnosis not present

## 2017-08-25 DIAGNOSIS — Z79899 Other long term (current) drug therapy: Secondary | ICD-10-CM | POA: Insufficient documentation

## 2017-08-25 LAB — CBC WITH DIFFERENTIAL/PLATELET
BASOS PCT: 1 %
Basophils Absolute: 0 10*3/uL (ref 0.0–0.1)
Eosinophils Absolute: 0 10*3/uL (ref 0.0–0.7)
Eosinophils Relative: 1 %
HCT: 35.6 % — ABNORMAL LOW (ref 36.0–46.0)
Hemoglobin: 11.9 g/dL — ABNORMAL LOW (ref 12.0–15.0)
LYMPHS PCT: 41 %
Lymphs Abs: 2 10*3/uL (ref 0.7–4.0)
MCH: 30.7 pg (ref 26.0–34.0)
MCHC: 33.4 g/dL (ref 30.0–36.0)
MCV: 92 fL (ref 78.0–100.0)
MONO ABS: 0.2 10*3/uL (ref 0.1–1.0)
Monocytes Relative: 5 %
Neutro Abs: 2.5 10*3/uL (ref 1.7–7.7)
Neutrophils Relative %: 52 %
Platelets: 349 10*3/uL (ref 150–400)
RBC: 3.87 MIL/uL (ref 3.87–5.11)
RDW: 12.1 % (ref 11.5–15.5)
WBC: 4.8 10*3/uL (ref 4.0–10.5)

## 2017-08-25 LAB — COMPREHENSIVE METABOLIC PANEL
ALT: 23 U/L (ref 0–44)
AST: 19 U/L (ref 15–41)
Albumin: 4 g/dL (ref 3.5–5.0)
Alkaline Phosphatase: 56 U/L (ref 38–126)
Anion gap: 7 (ref 5–15)
BUN: 11 mg/dL (ref 6–20)
CO2: 25 mmol/L (ref 22–32)
CREATININE: 0.68 mg/dL (ref 0.44–1.00)
Calcium: 9 mg/dL (ref 8.9–10.3)
Chloride: 104 mmol/L (ref 98–111)
GFR calc Af Amer: >60 mL/min (ref >60)
GFR calc non Af Amer: >60 mL/min (ref >60)
Glucose, Bld: 81 mg/dL (ref 70–99)
Potassium: 3.7 mmol/L (ref 3.5–5.1)
Sodium: 136 mmol/L (ref 135–145)
Total Bilirubin: 0.6 mg/dL (ref 0.3–1.2)
Total Protein: 7.1 g/dL (ref 6.5–8.1)

## 2017-08-25 LAB — LACTATE DEHYDROGENASE: LDH: 138 U/L (ref 98–192)

## 2017-08-25 LAB — FERRITIN: Ferritin: 125 ng/mL (ref 11–307)

## 2017-08-25 NOTE — Patient Instructions (Signed)
[facility] at [facility] Discharge Instructions  Today you saw Dr. Higgs   Thank you for choosing [facility] at [facility] to provide your oncology and hematology care.  To afford each patient quality time with our provider, please arrive at least 15 minutes before your scheduled appointment time.   If you have a lab appointment with the Cancer Center please come in thru the  Main Entrance and check in at the main information desk  You need to re-schedule your appointment should you arrive 10 or more minutes late.  We strive to give you quality time with our providers, and arriving late affects you and other patients whose appointments are after yours.  Also, if you no show three or more times for appointments you may be dismissed from the clinic at the providers discretion.     Again, thank you for choosing [facility].  Our hope is that these requests will decrease the amount of time that you wait before being seen by our physicians.       _____________________________________________________________  Should you have questions after your visit to [facility], please contact our office at [phone] between the hours of 8:00 a.m. and 4:30 p.m.  Voicemails left after 4:00 p.m. will not be returned until the following business day.  For prescription refill requests, have your pharmacy contact our office and allow 72 hours.    Cancer Center Support Programs:   > Cancer Support Group  2nd Tuesday of the month 1pm-2pm, Journey Room   

## 2017-08-25 NOTE — Progress Notes (Signed)
Diagnosis Iron deficiency anemia due to chronic blood loss - Plan: CBC with Differential/Platelet, Comprehensive metabolic panel, Lactate dehydrogenase, Ferritin  Staging Cancer Staging No matching staging information was found for the patient.  Assessment and Plan:  1.  Iron deficiency anemia likely due to menorrhalgia.  Pt was last treated with IV iron on March 03, 2017.  Labs done 08/25/2017 reviewed with pt and shows WBC 4.8 HB 11.9 plts 349,000.  Chemistries WNL. Ferritin is pending and she will be notified of results.  I have discussed with her HB remains stable at 12.  She is referred to GI for evaluation. I suspect counts will remain stable since menstrual cycles have improved after ablation.  Pt will be seen for follow-up in 12/2017.    2.  Menorrhagia.  Patient has undergone ablation on 03/27/2017.  She reports cycles have improved.  She should continue to follow-up with GYN as recommended.  3.  Thrombocytosis.  This was likely reactive due to iron deficiency.  Platelet count is WNL at 349,000.    4.  Health maintenance.  Patient reports she has undergone mammogram evaluation in the past.  She is due for next mammogram at 42. She is referred to GI for evaluation due to IDA.     Interval History: 41-year-old female with iron deficiency anemia and menorrhagia.  She has undergone uterine ablation and has been treated with IV iron.  Current Status: Patient is seen today for follow-up.  She is here today to go over lab studies.  She reports cycles are improved since ablation.    Problem List Patient Active Problem List   Diagnosis Date Noted  . Sciatica of left side [M54.32] 05/26/2017  . Uterine leiomyoma [D25.9] 05/26/2017  . Microcytic anemia [D50.9] 10/22/2016  . Thrombocytosis (HCC) [D47.3] 05/30/2015  . 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 Ankle swelling [M25.473] 05/29/2015    Past Medical History Past Medical History:  Diagnosis Date  .  Anemia   . HSV-1 (herpes simplex virus 1) infection     Past Surgical History Past Surgical History:  Procedure Laterality Date  . ANTERIOR CRUCIATE LIGAMENT REPAIR Left 2001    Family History Family History  Problem Relation Age of Onset  . Cancer Father        lung  . Schizophrenia Brother   . Diabetes Brother   . Hypertension Brother   . Cancer Maternal Grandmother      Social History  reports that she has never smoked. She has never used smokeless tobacco. She reports that she drinks alcohol. She reports that she does not use drugs.  Medications  Current Outpatient Medications:  .  ferrous sulfate 325 (65 FE) MG tablet, TAKE 1 TABLET (325 MG TOTAL) 2 (TWO) TIMES DAILY WITH A MEAL BY MOUTH. (Patient taking differently: Take 325 mg by mouth daily with breakfast. ), Disp: 180 tablet, Rfl: 0 .  ibuprofen (ADVIL,MOTRIN) 200 MG tablet, Take 200 mg by mouth every 6 (six) hours as needed for headache or moderate pain., Disp: , Rfl:   Allergies Patient has no known allergies.  Review of Systems Review of Systems - Oncology ROS negative   Physical Exam  Vitals Wt Readings from Last 3 Encounters:  08/25/17 205 lb 1.6 oz (93 kg)  05/26/17 217 lb (98.4 kg)  04/21/17 215 lb 14.4 oz (97.9 kg)   Temp Readings from Last 3 Encounters:  08/25/17 98.6 F (37 C) (Oral)  05/26/17 97.7 F (36.5 C) (Oral)    04/21/17 98.1 F (36.7 C)   BP Readings from Last 3 Encounters:  08/25/17 111/80  05/26/17 104/66  04/21/17 102/65   Pulse Readings from Last 3 Encounters:  08/25/17 74  05/26/17 73  04/21/17 80   Constitutional: Well-developed, well-nourished, and in no distress.   HENT: Head: Normocephalic and atraumatic.  Mouth/Throat: No oropharyngeal exudate. Mucosa moist. Eyes: Pupils are equal, round, and reactive to light. Conjunctivae are normal. No scleral icterus.  Neck: Normal range of motion. Neck supple. No JVD present.  Cardiovascular: Normal rate, regular rhythm and  normal heart sounds.  Exam reveals no gallop and no friction rub.   No murmur heard. Pulmonary/Chest: Effort normal and breath sounds normal. No respiratory distress. No wheezes.No rales.  Abdominal: Soft. Bowel sounds are normal. No distension. There is no tenderness. There is no guarding.  Musculoskeletal: No edema or tenderness.  Lymphadenopathy: No cervical, axillary or supraclavicular adenopathy.  Neurological: Alert and oriented to person, place, and time. No cranial nerve deficit.  Skin: Skin is warm and dry. No rash noted. No erythema. No pallor.  Psychiatric: Affect and judgment normal.   Labs Appointment on 08/25/2017  Component Date Value Ref Range Status  . WBC 08/25/2017 4.8  4.0 - 10.5 K/uL Final  . RBC 08/25/2017 3.87  3.87 - 5.11 MIL/uL Final  . Hemoglobin 08/25/2017 11.9* 12.0 - 15.0 g/dL Final  . HCT 08/25/2017 35.6* 36.0 - 46.0 % Final  . MCV 08/25/2017 92.0  78.0 - 100.0 fL Final  . MCH 08/25/2017 30.7  26.0 - 34.0 pg Final  . MCHC 08/25/2017 33.4  30.0 - 36.0 g/dL Final  . RDW 08/25/2017 12.1  11.5 - 15.5 % Final  . Platelets 08/25/2017 349  150 - 400 K/uL Final  . Neutrophils Relative % 08/25/2017 52  % Final  . Neutro Abs 08/25/2017 2.5  1.7 - 7.7 K/uL Final  . Lymphocytes Relative 08/25/2017 41  % Final  . Lymphs Abs 08/25/2017 2.0  0.7 - 4.0 K/uL Final  . Monocytes Relative 08/25/2017 5  % Final  . Monocytes Absolute 08/25/2017 0.2  0.1 - 1.0 K/uL Final  . Eosinophils Relative 08/25/2017 1  % Final  . Eosinophils Absolute 08/25/2017 0.0  0.0 - 0.7 K/uL Final  . Basophils Relative 08/25/2017 1  % Final  . Basophils Absolute 08/25/2017 0.0  0.0 - 0.1 K/uL Final   Performed at [facility], [address] [place], [place] [address]  . Sodium 08/25/2017 136  135 - 145 mmol/L Final  . Potassium 08/25/2017 3.7  3.5 - 5.1 mmol/L Final  . Chloride 08/25/2017 104  98 - 111 mmol/L Final  . CO2 08/25/2017 25  22 - 32 mmol/L Final  . Glucose, Bld 08/25/2017 81  70 -  99 mg/dL Final  . BUN 08/25/2017 11  6 - 20 mg/dL Final  . Creatinine, Ser 08/25/2017 0.68  0.44 - 1.00 mg/dL Final  . Calcium 08/25/2017 9.0  8.9 - 10.3 mg/dL Final  . Total Protein 08/25/2017 7.1  6.5 - 8.1 g/dL Final  . Albumin 08/25/2017 4.0  3.5 - 5.0 g/dL Final  . AST 08/25/2017 19  15 - 41 U/L Final  . ALT 08/25/2017 23  0 - 44 U/L Final  . Alkaline Phosphatase 08/25/2017 56  38 - 126 U/L Final  . Total Bilirubin 08/25/2017 0.6  0.3 - 1.2 mg/dL Final  . GFR calc non Af Amer 08/25/2017 >60  >60 mL/min Final  . GFR calc Af Amer 08/25/2017 >  60  >60 mL/min Final   Comment: (NOTE) The eGFR has been calculated using the CKD EPI equation. This calculation has not been validated in all clinical situations. eGFR's persistently <60 mL/min signify possible Chronic Kidney Disease.   Georgiann Hahn gap 08/25/2017 7  5 - 15 Final   Performed at [facility], [address], [place], [place] [address]  . LDH 08/25/2017 138  98 - 192 U/L Final   Performed at [facility], [address], [place], [place] [address]     Pathology Orders Placed This Encounter  Procedures  . CBC with Differential/Platelet    Standing Status:   Future    Standing Expiration Date:   08/26/2018  . Comprehensive metabolic panel    Standing Status:   Future    Standing Expiration Date:   08/26/2018  . Lactate dehydrogenase    Standing Status:   Future    Standing Expiration Date:   08/26/2018  . Ferritin    Standing Status:   Future    Standing Expiration Date:   08/26/2018       Zoila Shutter MD

## 2017-08-27 ENCOUNTER — Encounter: Payer: Self-pay | Admitting: Gastroenterology

## 2017-08-31 ENCOUNTER — Other Ambulatory Visit: Payer: Self-pay | Admitting: Family Medicine

## 2017-09-15 ENCOUNTER — Telehealth: Payer: Self-pay | Admitting: Physician Assistant

## 2017-09-15 NOTE — Telephone Encounter (Signed)
Patient aware and verbalizes understanding. 

## 2017-09-15 NOTE — Telephone Encounter (Signed)
[facility] has an [employer], no back person in [place]. [facility] is also in [place].  She may be able to contact them herself for an appointment.  If not, we can make the referral.

## 2017-09-15 NOTE — Telephone Encounter (Signed)
What type of referral do you need? Lower back  Have you been seen at our office for this problem? 4-29 with Particia Nearing (If no, schedule them an appointment.  They will need to be seen before a referral can be done.)  Is there a particular doctor or location that you prefer? In the [facility] system. Would like [place] or [facility]  Patient notified that referrals can take up to a week or longer to process. If they haven't heard anything within a week they should call back and speak with the referral department.

## 2017-10-16 ENCOUNTER — Other Ambulatory Visit: Payer: Self-pay | Admitting: Family Medicine

## 2017-10-16 DIAGNOSIS — Z1231 Encounter for screening mammogram for malignant neoplasm of breast: Secondary | ICD-10-CM

## 2017-10-27 ENCOUNTER — Encounter: Payer: Self-pay | Admitting: Family Medicine

## 2017-10-27 ENCOUNTER — Ambulatory Visit (INDEPENDENT_AMBULATORY_CARE_PROVIDER_SITE_OTHER): Payer: BLUE CROSS/BLUE SHIELD | Admitting: Family Medicine

## 2017-10-27 ENCOUNTER — Ambulatory Visit (HOSPITAL_COMMUNITY)
Admission: RE | Admit: 2017-10-27 | Discharge: 2017-10-27 | Disposition: A | Discharge status: Home / Self Care | Payer: BLUE CROSS/BLUE SHIELD | Source: Ambulatory Visit | Attending: Family Medicine | Admitting: Family Medicine

## 2017-10-27 VITALS — BP 110/71 | HR 76 | Temp 98.5°F | Ht 65.0 in | Wt 213.0 lb

## 2017-10-27 DIAGNOSIS — R600 Localized edema: Secondary | ICD-10-CM

## 2017-10-27 DIAGNOSIS — Z23 Encounter for immunization: Secondary | ICD-10-CM

## 2017-10-27 DIAGNOSIS — L989 Disorder of the skin and subcutaneous tissue, unspecified: Secondary | ICD-10-CM

## 2017-10-27 DIAGNOSIS — Z1231 Encounter for screening mammogram for malignant neoplasm of breast: Secondary | ICD-10-CM | POA: Diagnosis not present

## 2017-10-27 DIAGNOSIS — M5432 Sciatica, left side: Secondary | ICD-10-CM

## 2017-10-27 DIAGNOSIS — D500 Iron deficiency anemia secondary to blood loss (chronic): Secondary | ICD-10-CM

## 2017-10-27 DIAGNOSIS — Z0000 Encounter for general adult medical examination without abnormal findings: Secondary | ICD-10-CM | POA: Diagnosis not present

## 2017-10-27 DIAGNOSIS — E669 Obesity, unspecified: Secondary | ICD-10-CM

## 2017-10-27 NOTE — Patient Instructions (Signed)
You had labs performed today.  You will be contacted with the results of the labs once they are available, usually in the next 3 business days for routine lab work.    Edema Edema is an abnormal buildup of fluids in your bodytissues. Edema is somewhatdependent on gravity to pull the fluid to the lowest place in your body. That makes the condition more common in the legs and thighs (lower extremities). Painless swelling of the feet and ankles is common and becomes more likely as you get older. It is also common in looser tissues, like around your eyes. When the affected area is squeezed, the fluid may move out of that spot and leave a dent for a few moments. This dent is called pitting. What are the causes? There are many possible causes of edema. Eating too much salt and being on your feet or sitting for a long time can cause edema in your legs and ankles. Hot weather may make edema worse. Common medical causes of edema include:  Heart failure.  Liver disease.  Kidney disease.  Weak blood vessels in your legs.  Cancer.  An injury.  Pregnancy.  Some medications.  Obesity.  What are the signs or symptoms? Edema is usually painless.Your skin may look swollen or shiny. How is this diagnosed? Your health care provider may be able to diagnose edema by asking about your medical history and doing a physical exam. You may need to have tests such as X-rays, an electrocardiogram, or blood tests to check for medical conditions that may cause edema. How is this treated? Edema treatment depends on the cause. If you have heart, liver, or kidney disease, you need the treatment appropriate for these conditions. General treatment may include:  Elevation of the affected body part above the level of your heart.  Compression of the affected body part. Pressure from elastic bandages or support stockings squeezes the tissues and forces fluid back into the blood vessels. This keeps fluid from entering  the tissues.  Restriction of fluid and salt intake.  Use of a water pill (diuretic). These medications are appropriate only for some types of edema. They pull fluid out of your body and make you urinate more often. This gets rid of fluid and reduces swelling, but diuretics can have side effects. Only use diuretics as directed by your health care provider.  Follow these instructions at home:  Keep the affected body part above the level of your heart when you are lying down.  Do not sit still or stand for prolonged periods.  Do not put anything directly under your knees when lying down.  Do not wear constricting clothing or garters on your upper legs.  Exercise your legs to work the fluid back into your blood vessels. This may help the swelling go down.  Wear elastic bandages or support stockings to reduce ankle swelling as directed by your health care provider.  Eat a low-salt diet to reduce fluid if your health care provider recommends it.  Only take medicines as directed by your health care provider. Contact a health care provider if:  Your edema is not responding to treatment.  You have heart, liver, or kidney disease and notice symptoms of edema.  You have edema in your legs that does not improve after elevating them.  You have sudden and unexplained weight gain. Get help right away if:  You develop shortness of breath or chest pain.  You cannot breathe when you lie down.  You develop  pain, redness, or warmth in the swollen areas.  You have heart, liver, or kidney disease and suddenly get edema.  You have a fever and your symptoms suddenly get worse. This information is not intended to replace advice given to you by your health care provider. Make sure you discuss any questions you have with your health care provider. Document Released: 01/14/2005 Document Revised: 06/22/2015 Document Reviewed: 11/06/2012 Elsevier Interactive Patient Education  2017 [employer].

## 2017-10-27 NOTE — Progress Notes (Signed)
Janice Galvan is a 42 y.o. female presents to office today for annual physical exam examination.    Concerns today include: 1.  Skin lesion Patient reports a several month history of a skin lesion along the right lower side of her face.  She notes that it has something "growing out of the middle of it".  2.  Sciatica Patient reports difficulty with sciatica left but some on the right.  She notes that she had an MRI of her back which she states was unremarkable.  She would like to see an orthopedist for further evaluation.  She notes radiation of pain down the lower leg.  Substance use: none Diet: fair, eats salt; no significant VitD/Ca intake, Exercise: no structured Last eye exam: >2 years ago, Dr Janice Galvan Last dental exam: q6 months Immunizations needed: TDap  Past Medical History:  Diagnosis Date  . Anemia   . HSV-1 (herpes simplex virus 1) infection    Social History   Socioeconomic History  . Marital status: Single    Spouse name: Not on file  . Number of children: Not on file  . Years of education: Not on file  . Highest education level: Not on file  Occupational History  . Not on file  Social Needs  . Financial resource strain: Not on file  . Food insecurity:    Worry: Not on file    Inability: Not on file  . Transportation needs:    Medical: Not on file    Non-medical: Not on file  Tobacco Use  . Smoking status: Never Smoker  . Smokeless tobacco: Never Used  Substance and Sexual Activity  . Alcohol use: Yes  . Drug use: No  . Sexual activity: Not Currently    Birth control/protection: None  Lifestyle  . Physical activity:    Days per week: Not on file    Minutes per session: Not on file  . Stress: Not on file  Relationships  . Social connections:    Talks on phone: Not on file    Gets together: Not on file    Attends religious service: Not on file    Active member of club or organization: Not on file    Attends meetings of clubs or organizations:  Not on file    Relationship status: Not on file  . Intimate partner violence:    Fear of current or ex partner: Not on file    Emotionally abused: Not on file    Physically abused: Not on file    Forced sexual activity: Not on file  Other Topics Concern  . Not on file  Social History Narrative  . Not on file   Past Surgical History:  Procedure Laterality Date  . ANTERIOR CRUCIATE LIGAMENT REPAIR Left 2001   Family History  Problem Relation Age of Onset  . Cancer Father        lung  . Schizophrenia Brother   . Diabetes Brother   . Hypertension Brother   . Cancer Maternal Grandmother     Current Outpatient Medications:  .  ferrous sulfate 325 (65 FE) MG tablet, TAKE 1 TABLET BY MOUTH 2 TIMES DAILY WITH A MEAL BY MOUTH, Disp: 114 tablet, Rfl: 1 .  ibuprofen (ADVIL,MOTRIN) 200 MG tablet, Take 200 mg by mouth every 6 (six) hours as needed for headache or moderate pain., Disp: , Rfl:   No Known Allergies   ROS: Review of Systems Constitutional: negative Eyes: positive for contacts/glasses Ears, nose, mouth, throat,  and face: negative Respiratory: negative Cardiovascular: negative Gastrointestinal: negative Genitourinary:negative, doing much better since ablasion Integument/breast: skin lesion on lip Hematologic/lymphatic: negative Musculoskeletal:positive for back pain and sciatica Neurological: negative Behavioral/Psych: negative Endocrine: negative Allergic/Immunologic: negative    Physical exam BP 110/71   Pulse 76   Temp 98.5 F (36.9 C) (Oral)   Ht 5\' 5"  (1.651 m)   Wt 213 lb (96.6 kg)   BMI 35.45 kg/m  General appearance: alert, cooperative, appears stated age, no distress and mildly obese Head: Normocephalic, without obvious abnormality, atraumatic Eyes: conjunctivae/corneas clear. PERRL, EOM's intact. Fundi benign. Ears: normal TM's and external ear canals both ears Nose: Nares normal. Septum midline. Mucosa normal. No drainage or sinus  tenderness. Throat: lips, mucosa, and tongue normal; teeth and gums normal Neck: no adenopathy, no carotid bruit, no JVD, supple, symmetrical, trachea midline and thyroid not enlarged, symmetric, no tenderness/mass/nodules Back: symmetric, no curvature. ROM normal. No CVA tenderness. Lungs: clear to auscultation bilaterally Heart: regular rate and rhythm, S1, S2 normal, no murmur, click, rub or gallop Abdomen: soft, non-tender; bowel sounds normal; no masses,  no organomegaly Extremities: Warm, well-perfused, mild nonpitting edema noted to bilateral ankles. Pulses: 2+ and symmetric Skin: Skin color, texture, turgor normal. No rashes or lesions or there is a hyperkeratotic lesion noted along the right lower aspect of the face between the lower lip and chin.  Lymph nodes: Cervical, supraclavicular, and axillary nodes normal. Neurologic: Alert and oriented X 3, normal strength and tone. Normal symmetric reflexes. Normal coordination and gait    Assessment/ Plan: Janice Galvan here for annual physical exam.   1. Annual physical exam  2. Sciatica of left side I offered her NSAIDs or other medications but patient did not want to trial any medicine she wants formal eval by orthopedics.  I placed a referral. - Ambulatory referral to Orthopedic Surgery  3. Iron deficiency anemia due to chronic blood loss Hemoglobin reviewed from July.  It was noted to be slightly low at 11.9.  Not compliant with iron replacement but also not bleeding as regularly.  Will recheck today. - CBC with Differential/Platelet; Future  4. Bilateral leg edema Possibly related to anemia versus salt consumption.  She does sit quite a bit at work which may also be contributing.  We discussed compression hose and salt reduction. - TSH; Future - CBC with Differential/Platelet; Future  5. Obesity (BMI 35.0-39.9 without comorbidity) - TSH; Future - Lipid panel; Future  6. Skin lesion on examination - Ambulatory  referral to Dermatology   Counseled on healthy lifestyle choices, including diet (rich in fruits, vegetables and lean meats and low in salt and simple carbohydrates) and exercise (at least 30 minutes of moderate physical activity daily).  Patient to follow up in 1 year for annual exam or sooner if needed.  Sammie Schermerhorn M. Lajuana Ripple, DO

## 2017-10-30 ENCOUNTER — Other Ambulatory Visit (HOSPITAL_COMMUNITY): Payer: Self-pay | Admitting: Family Medicine

## 2017-10-30 ENCOUNTER — Other Ambulatory Visit: Payer: Self-pay | Admitting: Family Medicine

## 2017-10-30 DIAGNOSIS — R928 Other abnormal and inconclusive findings on diagnostic imaging of breast: Secondary | ICD-10-CM

## 2017-11-04 ENCOUNTER — Ambulatory Visit (HOSPITAL_COMMUNITY)
Admission: RE | Admit: 2017-11-04 | Discharge: 2017-11-04 | Disposition: A | Discharge status: Home / Self Care | Payer: BLUE CROSS/BLUE SHIELD | Source: Ambulatory Visit | Attending: Family Medicine | Admitting: Family Medicine

## 2017-11-04 ENCOUNTER — Encounter (HOSPITAL_COMMUNITY): Payer: BLUE CROSS/BLUE SHIELD

## 2017-11-04 DIAGNOSIS — R928 Other abnormal and inconclusive findings on diagnostic imaging of breast: Secondary | ICD-10-CM | POA: Diagnosis not present

## 2017-11-04 DIAGNOSIS — N6489 Other specified disorders of breast: Secondary | ICD-10-CM | POA: Diagnosis not present

## 2017-11-04 DIAGNOSIS — R922 Inconclusive mammogram: Secondary | ICD-10-CM | POA: Diagnosis not present

## 2017-11-13 DIAGNOSIS — L859 Epidermal thickening, unspecified: Secondary | ICD-10-CM | POA: Diagnosis not present

## 2017-11-13 DIAGNOSIS — D485 Neoplasm of uncertain behavior of skin: Secondary | ICD-10-CM | POA: Diagnosis not present

## 2017-12-01 DIAGNOSIS — M545 Low back pain: Secondary | ICD-10-CM | POA: Diagnosis not present

## 2017-12-03 ENCOUNTER — Encounter: Payer: Self-pay | Admitting: Gastroenterology

## 2017-12-03 ENCOUNTER — Encounter: Payer: Self-pay | Admitting: *Deleted

## 2017-12-03 ENCOUNTER — Ambulatory Visit (INDEPENDENT_AMBULATORY_CARE_PROVIDER_SITE_OTHER): Payer: BLUE CROSS/BLUE SHIELD | Admitting: Gastroenterology

## 2017-12-03 VITALS — BP 109/69 | HR 67 | Temp 98.5°F | Ht 65.0 in | Wt 215.0 lb

## 2017-12-03 DIAGNOSIS — D500 Iron deficiency anemia secondary to blood loss (chronic): Secondary | ICD-10-CM

## 2017-12-03 MED ORDER — PEG 3350-KCL-NA BICARB-NACL 420 G PO SOLR: 4000.0000 mL | Freq: Once | ORAL | 0 refills | Status: AC

## 2017-12-03 NOTE — Patient Instructions (Signed)
We have arranged a colonoscopy and possible upper endoscopy with Dr. Oneida Alar in the near future.  Further recommendations to follow!  It was a pleasure to see you today. I strive to create trusting relationships with patients to provide genuine, compassionate, and quality care. I value your feedback. If you receive a survey regarding your visit,  I greatly appreciate you taking time to fill this out.   Annitta Needs, PhD, ANP-BC [facility]

## 2017-12-03 NOTE — Progress Notes (Addendum)
REVIEWED-NO ADDITIONAL RECOMMENDATIONS.  Primary Care Physician:  Janora Norlander, DO Primary Gastroenterologist:  Dr. Oneida Alar   Chief Complaint  Patient presents with  . Anemia    HPI:   Janice Galvan is a 42 y.o. female presenting today at the request of Dr. Walden Field with [facility] due to history of IDA. History of heavy vaginal bleeding, s/p ablation Feb 2019.   One year ago Hgb was in the 7.5 range. Ferritin 5. She is followed by hematology closely. Received multiple iron infusions. Last Hgb 11.9 in July 2019. Ferritin 125.   Rare NSAIDs. No overt GI bleeding. No abdominal pain, N/V. No upper GI signs/symptoms. No unexplained weight loss or lack of appetite. Vaginal bleeding rare now. No prior colonoscopy/EGD.    Past Medical History:  Diagnosis Date  . Anemia   . HSV-1 (herpes simplex virus 1) infection     Past Surgical History:  Procedure Laterality Date  . ANTERIOR CRUCIATE LIGAMENT REPAIR Left 2001  . ENDOMETRIAL ABLATION  02/2017   [place], [place]    No current outpatient medications on file.   No current facility-administered medications for this visit.     Allergies as of 12/03/2017  . (No Known Allergies)    Family History  Problem Relation Age of Onset  . Cancer Father        lung  . Schizophrenia Brother   . Diabetes Brother   . Hypertension Brother   . Cancer Maternal Grandmother   . Colon cancer Neg Hx   . Colon polyps Neg Hx     Social History   Socioeconomic History  . Marital status: Single    Spouse name: Not on file  . Number of children: Not on file  . Years of education: Not on file  . Highest education level: Not on file  Occupational History  . Not on file  Social Needs  . Financial resource strain: Not on file  . Food insecurity:    Worry: Not on file    Inability: Not on file  . Transportation needs:    Medical: Not on file    Non-medical: Not on file  Tobacco Use  . Smoking status: Never Smoker  .  Smokeless tobacco: Never Used  Substance and Sexual Activity  . Alcohol use: Yes    Comment: occ  . Drug use: No  . Sexual activity: Not Currently    Birth control/protection: None  Lifestyle  . Physical activity:    Days per week: Not on file    Minutes per session: Not on file  . Stress: Not on file  Relationships  . Social connections:    Talks on phone: Not on file    Gets together: Not on file    Attends religious service: Not on file    Active member of club or organization: Not on file    Attends meetings of clubs or organizations: Not on file    Relationship status: Not on file  . Intimate partner violence:    Fear of current or ex partner: Not on file    Emotionally abused: Not on file    Physically abused: Not on file    Forced sexual activity: Not on file  Other Topics Concern  . Not on file  Social History Narrative  . Not on file    Review of Systems: Gen: Denies any fever, chills, fatigue, weight loss, lack of appetite.  CV: Denies chest pain, heart palpitations, peripheral edema, syncope.  Resp: Denies shortness of breath at rest or with exertion. Denies wheezing or cough.  GI: see HPI  GU : Denies urinary burning, urinary frequency, urinary hesitancy MS: Denies joint pain, muscle weakness, cramps, or limitation of movement.  Derm: Denies rash, itching, dry skin Psych: Denies depression, anxiety, memory loss, and confusion Heme: Denies bruising, bleeding, and enlarged lymph nodes.  Physical Exam: BP 109/69   Pulse 67   Temp 98.5 F (36.9 C) (Oral)   Ht 5\' 5"  (0.160 m)   Wt 215 lb (97.5 kg)   LMP 12/02/2017 (Exact Date)   BMI 35.78 kg/m  General:   Alert and oriented. Pleasant and cooperative. Well-nourished and well-developed.  Head:  Normocephalic and atraumatic. Eyes:  Without icterus, sclera clear and conjunctiva pink.  Ears:  Normal auditory acuity. Nose:  No deformity, discharge,  or lesions. Mouth:  No deformity or lesions, oral mucosa  pink.  Lungs:  Clear to auscultation bilaterally. No wheezes, rales, or rhonchi. No distress.  Heart:  S1, S2 present without murmurs appreciated.  Abdomen:  +BS, soft, non-tender and non-distended. No HSM noted. No guarding or rebound. No masses appreciated.  Rectal:  Deferred  Msk:  Symmetrical without gross deformities. Normal posture. Extremities:  Without edema. Neurologic:  Alert and  oriented x4 Skin:  Intact without significant lesions or rashes. Psych:  Alert and cooperative. Normal mood and affect.  Lab Results  Component Value Date   WBC 4.8 08/25/2017   HGB 11.9 (L) 08/25/2017   HCT 35.6 (L) 08/25/2017   MCV 92.0 08/25/2017   PLT 349 08/25/2017   Lab Results  Component Value Date   FERRITIN 125 08/25/2017

## 2017-12-05 ENCOUNTER — Encounter: Payer: Self-pay | Admitting: Neurology

## 2017-12-08 ENCOUNTER — Other Ambulatory Visit: Payer: Self-pay | Admitting: *Deleted

## 2017-12-08 DIAGNOSIS — G5602 Carpal tunnel syndrome, left upper limb: Secondary | ICD-10-CM

## 2017-12-08 NOTE — Assessment & Plan Note (Signed)
Pleasant 42 year old female with history of profound IDA last year in the setting of heavy menstrual cycles, now improved s/p ablation earlier this year. Hgb most recently 11.9 and ferritin 125 in July 2019. No overt GI bleeding. No family history of colorectal cancer or polyps. IDA likely due to known GYN source. However, she has been referred to GI to discuss any further evaluation due to IDA. As she has never had a colonoscopy, would recommend at a minimum to proceed with this. I have added a possible EGD if felt necessary at time of colonoscopy, but she has no concerning upper GI signs/symptoms.   Proceed with colonoscopy +/- EGD with Dr. Oneida Alar in the near future. The risks, benefits, and alternatives have been discussed in detail with the patient. They state understanding and desire to proceed.  Continue close follow-up with Hematology

## 2017-12-09 NOTE — Progress Notes (Signed)
cc'ed to pcp °

## 2017-12-29 ENCOUNTER — Inpatient Hospital Stay (HOSPITAL_COMMUNITY): Payer: BLUE CROSS/BLUE SHIELD | Attending: Hematology

## 2017-12-29 DIAGNOSIS — D500 Iron deficiency anemia secondary to blood loss (chronic): Secondary | ICD-10-CM | POA: Diagnosis not present

## 2017-12-29 DIAGNOSIS — N920 Excessive and frequent menstruation with regular cycle: Secondary | ICD-10-CM | POA: Insufficient documentation

## 2017-12-29 LAB — CBC WITH DIFFERENTIAL/PLATELET
Abs Immature Granulocytes: 0.01 10*3/uL (ref 0.00–0.07)
BASOS ABS: 0 10*3/uL (ref 0.0–0.1)
BASOS PCT: 1 %
EOS ABS: 0 10*3/uL (ref 0.0–0.5)
EOS PCT: 1 %
HEMATOCRIT: 40.5 % (ref 36.0–46.0)
Hemoglobin: 12.8 g/dL (ref 12.0–15.0)
Immature Granulocytes: 0 %
LYMPHS ABS: 1.9 10*3/uL (ref 0.7–4.0)
Lymphocytes Relative: 37 %
MCH: 29.3 pg (ref 26.0–34.0)
MCHC: 31.6 g/dL (ref 30.0–36.0)
MCV: 92.7 fL (ref 80.0–100.0)
Monocytes Absolute: 0.2 10*3/uL (ref 0.1–1.0)
Monocytes Relative: 3 %
NRBC: 0 % (ref 0.0–0.2)
Neutro Abs: 3 10*3/uL (ref 1.7–7.7)
Neutrophils Relative %: 58 %
Platelets: 388 10*3/uL (ref 150–400)
RBC: 4.37 MIL/uL (ref 3.87–5.11)
RDW: 11.7 % (ref 11.5–15.5)
WBC: 5.1 10*3/uL (ref 4.0–10.5)

## 2017-12-29 LAB — COMPREHENSIVE METABOLIC PANEL
ALT: 22 U/L (ref 0–44)
AST: 26 U/L (ref 15–41)
Albumin: 4.4 g/dL (ref 3.5–5.0)
Alkaline Phosphatase: 63 U/L (ref 38–126)
Anion gap: 8 (ref 5–15)
BUN: 7 mg/dL (ref 6–20)
CALCIUM: 9.2 mg/dL (ref 8.9–10.3)
CO2: 24 mmol/L (ref 22–32)
CREATININE: 0.74 mg/dL (ref 0.44–1.00)
Chloride: 105 mmol/L (ref 98–111)
GFR calc Af Amer: >60 mL/min (ref >60)
GFR calc non Af Amer: >60 mL/min (ref >60)
Glucose, Bld: 114 mg/dL — ABNORMAL HIGH (ref 70–99)
Potassium: 3.5 mmol/L (ref 3.5–5.1)
Sodium: 137 mmol/L (ref 135–145)
Total Bilirubin: 0.7 mg/dL (ref 0.3–1.2)
Total Protein: 8.1 g/dL (ref 6.5–8.1)

## 2017-12-29 LAB — FERRITIN: Ferritin: 140 ng/mL (ref 11–307)

## 2017-12-29 LAB — LACTATE DEHYDROGENASE: LDH: 166 U/L (ref 98–192)

## 2018-01-05 ENCOUNTER — Encounter (HOSPITAL_COMMUNITY): Payer: Self-pay | Admitting: Hematology

## 2018-01-05 ENCOUNTER — Inpatient Hospital Stay (HOSPITAL_BASED_OUTPATIENT_CLINIC_OR_DEPARTMENT_OTHER): Payer: BLUE CROSS/BLUE SHIELD | Admitting: Hematology

## 2018-01-05 ENCOUNTER — Other Ambulatory Visit: Payer: Self-pay

## 2018-01-05 DIAGNOSIS — N920 Excessive and frequent menstruation with regular cycle: Secondary | ICD-10-CM | POA: Diagnosis not present

## 2018-01-05 DIAGNOSIS — D500 Iron deficiency anemia secondary to blood loss (chronic): Secondary | ICD-10-CM | POA: Diagnosis not present

## 2018-01-05 NOTE — Assessment & Plan Note (Signed)
1.  Iron deficiency anemia: -This patient had a history of iron deficiency in 2018 secondary to menorrhagia from fibroids.  She underwent uterine ablation on 03/03/2017. - Since then her menses have become lighter.  She has 5 days of light bleeding every 28 days. -She denies any rectal bleeding or melena.  Reportedly her stool was tested negative for occult blood. -She received Feraheme last time on 03/03/2017. -Today's physical examination did not reveal any abnormalities. - We reviewed the blood work.  Hemoglobin is 12.8.  Ferritin is 140.  White count and platelet count were within normal limits.  She does not take any iron pills. - No further work-up is needed.  She will come back in 4 months for follow-up.  If her counts remain stable at that time, we will switch her to 41-month visits.

## 2018-01-05 NOTE — Patient Instructions (Signed)
[facility] at [facility] Discharge Instructions     Thank you for choosing [facility] at [facility] to provide your oncology and hematology care.  To afford each patient quality time with our provider, please arrive at least 15 minutes before your scheduled appointment time.   If you have a lab appointment with the Cancer Center please come in thru the  Main Entrance and check in at the main information desk  You need to re-schedule your appointment should you arrive 10 or more minutes late.  We strive to give you quality time with our providers, and arriving late affects you and other patients whose appointments are after yours.  Also, if you no show three or more times for appointments you may be dismissed from the clinic at the providers discretion.     Again, thank you for choosing [facility].  Our hope is that these requests will decrease the amount of time that you wait before being seen by our physicians.       _____________________________________________________________  Should you have questions after your visit to [facility], please contact our office at [phone] between the hours of 8:00 a.m. and 4:30 p.m.  Voicemails left after 4:00 p.m. will not be returned until the following business day.  For prescription refill requests, have your pharmacy contact our office and allow 72 hours.    Cancer Center Support Programs:   > Cancer Support Group  2nd Tuesday of the month 1pm-2pm, Journey Room    

## 2018-01-05 NOTE — Progress Notes (Signed)
[place], [place] [address]   CLINIC:  Medical Oncology/Hematology  PCP:  Janora Norlander, DO [place] [id number] [phone]   REASON FOR VISIT: Follow-up for iron deficiency anemia   CURRENT THERAPY: intermittent iron infusions.   INTERVAL HISTORY:  Janice Galvan 42 y.o. female returns for routine follow-up for iron deficiency anemia. She does not take oral iron due to her not being able to remember to take pills. She overall feels good and has no complaints. She denies any rectal bleeding. Denies any fevers or recent infections. Denies any new pains. Denies any easy bruising. She reports her appetite and energy level at 100% and she has no problems maintaining her weight. She had a uterine ablation in Feb and her periods are lighter. She still has periods but they are much lighter and only last a few days now.   REVIEW OF SYSTEMS:  Review of Systems  All other systems reviewed and are negative.    PAST MEDICAL/SURGICAL HISTORY:  Past Medical History:  Diagnosis Date  . Anemia   . HSV-1 (herpes simplex virus 1) infection    Past Surgical History:  Procedure Laterality Date  . ANTERIOR CRUCIATE LIGAMENT REPAIR Left 2001  . ENDOMETRIAL ABLATION  02/2017   [place], [place]     SOCIAL HISTORY:  Social History   Socioeconomic History  . Marital status: Single    Spouse name: Not on file  . Number of children: Not on file  . Years of education: Not on file  . Highest education level: Not on file  Occupational History  . Not on file  Social Needs  . Financial resource strain: Not on file  . Food insecurity:    Worry: Not on file    Inability: Not on file  . Transportation needs:    Medical: Not on file    Non-medical: Not on file  Tobacco Use  . Smoking status: Never Smoker  . Smokeless tobacco: Never Used  Substance and Sexual Activity  . Alcohol use: Yes    Comment: occ  . Drug use: No  . Sexual  activity: Not Currently    Birth control/protection: None  Lifestyle  . Physical activity:    Days per week: Not on file    Minutes per session: Not on file  . Stress: Not on file  Relationships  . Social connections:    Talks on phone: Not on file    Gets together: Not on file    Attends religious service: Not on file    Active member of club or organization: Not on file    Attends meetings of clubs or organizations: Not on file    Relationship status: Not on file  . Intimate partner violence:    Fear of current or ex partner: Not on file    Emotionally abused: Not on file    Physically abused: Not on file    Forced sexual activity: Not on file  Other Topics Concern  . Not on file  Social History Narrative  . Not on file    FAMILY HISTORY:  Family History  Problem Relation Age of Onset  . Cancer Father        lung  . Schizophrenia Brother   . Diabetes Brother   . Hypertension Brother   . Cancer Maternal Grandmother   . Colon cancer Neg Hx   . Colon polyps Neg Hx     CURRENT MEDICATIONS:  No outpatient encounter medications on file as of 01/05/2018.   No facility-administered encounter medications on file as of 01/05/2018.     ALLERGIES:  No Known Allergies   PHYSICAL EXAM:  ECOG Performance status: 1  Vitals:   01/05/18 0821  BP: 112/67  Pulse: 69  Resp: 16  Temp: 98.4 F (36.9 C)  SpO2: 100%   Filed Weights   01/05/18 0821  Weight: 214 lb 8 oz (97.3 kg)    Physical Exam  Constitutional: She is oriented to person, place, and time. She appears well-developed and well-nourished.  Neck: Normal range of motion. Neck supple.  Cardiovascular: Normal rate, regular rhythm and normal heart sounds.  Pulmonary/Chest: Effort normal and breath sounds normal.  Abdominal: Soft.  Musculoskeletal: Normal range of motion.  Neurological: She is alert and oriented to person, place, and time.  Skin: Skin is warm and dry.  Psychiatric: She has a normal mood and  affect. Her behavior is normal. Judgment and thought content normal.     LABORATORY DATA:  I have reviewed the labs as listed.  CBC    Component Value Date/Time   WBC 5.1 12/29/2017 1533   RBC 4.37 12/29/2017 1533   HGB 12.8 12/29/2017 1533   HGB 7.9 (<) 10/23/2016 1549   HCT 40.5 12/29/2017 1533   HCT 27.5 (L) 10/23/2016 1549   PLT 388 12/29/2017 1533   PLT 595 (H) 10/23/2016 1549   MCV 92.7 12/29/2017 1533   MCV 73 (L) 10/23/2016 1549   MCH 29.3 12/29/2017 1533   MCHC 31.6 12/29/2017 1533   RDW 11.7 12/29/2017 1533   RDW 17.2 (H) 10/23/2016 1549   LYMPHSABS 1.9 12/29/2017 1533   LYMPHSABS 1.9 10/23/2016 1549   MONOABS 0.2 12/29/2017 1533   EOSABS 0.0 12/29/2017 1533   EOSABS 0.1 10/23/2016 1549   BASOSABS 0.0 12/29/2017 1533   BASOSABS 0.0 10/23/2016 1549   CMP Latest Ref Rng & Units 12/29/2017 08/25/2017 04/21/2017  Glucose 70 - 99 mg/dL 114(H) 81 80  BUN 6 - 20 mg/dL 7 11 9   Creatinine 0.44 - 1.00 mg/dL 0.74 0.68 0.80  Sodium 135 - 145 mmol/L 137 136 136  Potassium 3.5 - 5.1 mmol/L 3.5 3.7 3.9  Chloride 98 - 111 mmol/L 105 104 103  CO2 22 - 32 mmol/L 24 25 22   Calcium 8.9 - 10.3 mg/dL 9.2 9.0 9.1  Total Protein 6.5 - 8.1 g/dL 8.1 7.1 7.5  Total Bilirubin 0.3 - 1.2 mg/dL 0.7 0.6 0.9  Alkaline Phos 38 - 126 U/L 63 56 79  AST 15 - 41 U/L 26 19 19   ALT 0 - 44 U/L 22 23 25      I have reviewed Janice Finders, NP's note and agree with the documentation.  I personally performed a face-to-face visit, made revisions and my assessment and plan is as follows.       ASSESSMENT & PLAN:   Iron deficiency anemia due to chronic blood loss 1.  Iron deficiency anemia: -This patient had a history of iron deficiency in 2018 secondary to menorrhagia from fibroids.  She underwent uterine ablation on 03/03/2017. - Since then her menses have become lighter.  She has 5 days of light bleeding every 28 days. -She denies any rectal bleeding or melena.  Reportedly her stool was tested  negative for occult blood. -She received Feraheme last time on 03/03/2017. -Today's physical examination did not reveal any abnormalities. - We reviewed the blood work.  Hemoglobin is 12.8.  Ferritin is 140.  White count and platelet count were within normal limits.  She does not take any iron pills. - No further work-up is needed.  She will come back in 4 months for follow-up.  If her counts remain stable at that time, we will switch her to 39-month visits.      Orders placed this encounter:  No orders of the defined types were placed in this encounter.     Derek Jack, MD [place] [phone]

## 2018-01-08 ENCOUNTER — Ambulatory Visit (INDEPENDENT_AMBULATORY_CARE_PROVIDER_SITE_OTHER): Payer: BLUE CROSS/BLUE SHIELD | Admitting: Neurology

## 2018-01-08 DIAGNOSIS — R202 Paresthesia of skin: Secondary | ICD-10-CM | POA: Diagnosis not present

## 2018-01-08 NOTE — Procedures (Signed)
[facility]  [address]  [place], [place] [address] Tel: [phone] Fax:  [phone] Test Date:  01/08/2018  Patient: Janice Galvan DOB: 05/06/1975 Physician: Narda Amber, DO  Sex: Female Height: 5\' 5"  Ref Phys: Berle Mull, MD  ID#: [id number] Temp: 31.0C Technician:    Patient Complaints: This is a 42 year old female referred for evaluation of left leg pain and paresthesias.  NCV & EMG Findings: Extensive electrodiagnostic testing of the left lower extremity shows: 1. Left sural and superficial peroneal sensory responses are within normal limits. 2. Left peroneal and tibial motor responses are within normal limits. 3. Left tibial H reflex study is within normal limits. 4. There is no evidence of active or chronic motor axonal loss changes affecting any of the tested muscles.  Motor unit configuration and recruitment pattern is within normal limits.  Impression: This is a normal study of the left lower extremity.  In particular, there is no evidence of a sensorimotor polyneuropathy or lumbosacral radiculopathy.   ___________________________ Narda Amber, DO    Nerve Conduction Studies Anti Sensory Summary Table   Site NR Peak (ms) Norm Peak (ms) P-T Amp (V) Norm P-T Amp  Left Sup Peroneal Anti Sensory (Ant Lat Mall)  31C  12 cm    2.7 <4.5 7.9 >5  Left Sural Anti Sensory (Lat Mall)  31C  Calf    2.9 <4.5 23.5 >5   Motor Summary Table   Site NR Onset (ms) Norm Onset (ms) O-P Amp (mV) Norm O-P Amp Site1 Site2 Delta-0 (ms) Dist (cm) Vel (m/s) Norm Vel (m/s)  Left Peroneal Motor (Ext Dig Brev)  31C  Ankle    2.7 <5.5 3.9 >3 B Fib Ankle 8.6 38.0 44 >40  B Fib    11.3  3.5  Poplt B Fib 1.2 8.0 67 >40  Poplt    12.5  3.4         Left Tibial Motor (Abd Hall Brev)  31C  Ankle    6.0 <6.0 8.2 >8 Knee Ankle 5.7 38.0 67 >40  Knee    12.9  4.2          H Reflex Studies   NR H-Lat (ms) Lat Norm (ms) L-R H-Lat (ms)  Left Tibial  (Gastroc)  31C     32.79 <35    EMG   Side Muscle Ins Act Fibs Psw Fasc Number Recrt Dur Dur. Amp Amp. Poly Poly. Comment  Left AntTibialis Nml Nml Nml Nml Nml Nml Nml Nml Nml Nml Nml Nml N/A  Left Gastroc Nml Nml Nml Nml Nml Nml Nml Nml Nml Nml Nml Nml N/A  Left Flex Dig Long Nml Nml Nml Nml Nml Nml Nml Nml Nml Nml Nml Nml N/A  Left RectFemoris Nml Nml Nml Nml Nml Nml Nml Nml Nml Nml Nml Nml N/A  Left GluteusMed Nml Nml Nml Nml Nml Nml Nml Nml Nml Nml Nml Nml N/A  Left BicepsFemS Nml Nml Nml Nml Nml Nml Nml Nml Nml Nml Nml Nml N/A      Waveforms:

## 2018-01-26 ENCOUNTER — Encounter (HOSPITAL_COMMUNITY)
Admission: RE | Disposition: A | Discharge status: Home / Self Care | Payer: Self-pay | Source: Home / Self Care | Attending: Gastroenterology

## 2018-01-26 ENCOUNTER — Ambulatory Visit (HOSPITAL_COMMUNITY)
Admission: RE | Admit: 2018-01-26 | Discharge: 2018-01-26 | Disposition: A | Discharge status: Home / Self Care | Payer: BLUE CROSS/BLUE SHIELD | Attending: Gastroenterology | Admitting: Gastroenterology

## 2018-01-26 ENCOUNTER — Telehealth: Payer: Self-pay | Admitting: Gastroenterology

## 2018-01-26 ENCOUNTER — Other Ambulatory Visit: Payer: Self-pay

## 2018-01-26 ENCOUNTER — Encounter (HOSPITAL_COMMUNITY): Payer: Self-pay

## 2018-01-26 DIAGNOSIS — D125 Benign neoplasm of sigmoid colon: Secondary | ICD-10-CM

## 2018-01-26 DIAGNOSIS — D509 Iron deficiency anemia, unspecified: Secondary | ICD-10-CM | POA: Diagnosis not present

## 2018-01-26 DIAGNOSIS — K648 Other hemorrhoids: Secondary | ICD-10-CM | POA: Diagnosis not present

## 2018-01-26 DIAGNOSIS — D500 Iron deficiency anemia secondary to blood loss (chronic): Secondary | ICD-10-CM

## 2018-01-26 DIAGNOSIS — Z8249 Family history of ischemic heart disease and other diseases of the circulatory system: Secondary | ICD-10-CM | POA: Diagnosis not present

## 2018-01-26 DIAGNOSIS — K2940 Chronic atrophic gastritis without bleeding: Secondary | ICD-10-CM | POA: Insufficient documentation

## 2018-01-26 DIAGNOSIS — Z801 Family history of malignant neoplasm of trachea, bronchus and lung: Secondary | ICD-10-CM | POA: Diagnosis not present

## 2018-01-26 DIAGNOSIS — Z818 Family history of other mental and behavioral disorders: Secondary | ICD-10-CM | POA: Diagnosis not present

## 2018-01-26 DIAGNOSIS — K317 Polyp of stomach and duodenum: Secondary | ICD-10-CM | POA: Diagnosis not present

## 2018-01-26 DIAGNOSIS — K635 Polyp of colon: Secondary | ICD-10-CM | POA: Diagnosis not present

## 2018-01-26 DIAGNOSIS — Z833 Family history of diabetes mellitus: Secondary | ICD-10-CM | POA: Diagnosis not present

## 2018-01-26 HISTORY — PX: COLONOSCOPY: SHX5424

## 2018-01-26 HISTORY — PX: ESOPHAGOGASTRODUODENOSCOPY: SHX5428

## 2018-01-26 HISTORY — PX: BIOPSY: SHX5522

## 2018-01-26 HISTORY — PX: POLYPECTOMY: SHX5525

## 2018-01-26 SURGERY — COLONOSCOPY
Anesthesia: Moderate Sedation

## 2018-01-26 MED ORDER — MIDAZOLAM HCL 5 MG/5ML IJ SOLN
INTRAMUSCULAR | Status: DC | PRN
  Administered 2018-01-26: 1 mg via INTRAVENOUS
  Administered 2018-01-26: 2 mg via INTRAVENOUS
  Administered 2018-01-26 (×2): 1 mg via INTRAVENOUS
  Administered 2018-01-26: 2 mg via INTRAVENOUS

## 2018-01-26 MED ORDER — STERILE WATER FOR IRRIGATION IR SOLN
Status: DC | PRN
  Administered 2018-01-26: 1.5 mL

## 2018-01-26 MED ORDER — MEPERIDINE HCL 100 MG/ML IJ SOLN
INTRAMUSCULAR | Status: AC
  Filled 2018-01-26: qty 2

## 2018-01-26 MED ORDER — MEPERIDINE HCL 100 MG/ML IJ SOLN
INTRAMUSCULAR | Status: DC | PRN
  Administered 2018-01-26 (×4): 25 mg via INTRAVENOUS

## 2018-01-26 MED ORDER — LIDOCAINE VISCOUS HCL 2 % MT SOLN
OROMUCOSAL | Status: AC
  Filled 2018-01-26: qty 15

## 2018-01-26 MED ORDER — MIDAZOLAM HCL 5 MG/5ML IJ SOLN
INTRAMUSCULAR | Status: AC
  Filled 2018-01-26: qty 10

## 2018-01-26 MED ORDER — SODIUM CHLORIDE 0.9 % IV SOLN
INTRAVENOUS | Status: DC
  Administered 2018-01-26: 12:00:00 via INTRAVENOUS

## 2018-01-26 NOTE — Discharge Instructions (Signed)
You have internal hemorrhoids, and HAD 2 small polyps removed. You have benign appearing stomach polyps.I biopsied your stomach AND SMALL BOWEL. THE LAST PART OF YOUR SMALL BOWEL IS NORMAL. YOU HAVE AN EXTREMELY REDUNDANT COLON. IT TOOK ME 20 MINS TO REACH THE CECUM AND IT USUALLY TAKES LESS THAN 3 MINS.   DRINK WATER TO KEEP YOUR URINE LIGHT YELLOW.  CONTINUE YOUR WEIGHT LOSS EFFORTS. YOUR BODY MASS INDEX IS OVER 30 WHICH MEANS YOU ARE OBESE. OBESITY IS ASSOCIATED WITH AN INCREASED FOR CIRRHOSIS AND ALL CANCERS, INCLUDING ESOPHAGEAL AND COLON CANCER. A WEIGHT OF 180 LBS OR LESS  WILL GET YOUR BODY MASS INDEX(BMI) UNDER 30.   FOLLOW A HIGH FIBER/LOW FAT DIET. AVOID ITEMS THAT CAUSE BLOATING. SEE INFO BELOW.   YOUR BIOPSY RESULTS WILL BE BACK IN 5 BUSINESS DAYS.  FOLLOW UP IN 4 MOS.   Next colonoscopy in 5-10 years.   ENDOSCOPY Care After Read the instructions outlined below and refer to this sheet in the next week. These discharge instructions provide you with general information on caring for yourself after you leave the hospital. While your treatment has been planned according to the most current medical practices available, unavoidable complications occasionally occur. If you have any problems or questions after discharge, call DR. Angeleen Horney, [phone].  ACTIVITY  You may resume your regular activity, but move at a slower pace for the next 24 hours.   Take frequent rest periods for the next 24 hours.   Walking will help get rid of the air and reduce the bloated feeling in your belly (abdomen).   No driving for 24 hours (because of the medicine (anesthesia) used during the test).   You may shower.   Do not sign any important legal documents or operate any machinery for 24 hours (because of the anesthesia used during the test).    NUTRITION  Drink plenty of fluids.   You may resume your normal diet as instructed by your doctor.   Begin with a light meal and progress to  your normal diet. Heavy or fried foods are harder to digest and may make you feel sick to your stomach (nauseated).   Avoid alcoholic beverages for 24 hours or as instructed.    MEDICATIONS  You may resume your normal medications.   WHAT YOU CAN EXPECT TODAY  Some feelings of bloating in the abdomen.   Passage of more gas than usual.   Spotting of blood in your stool or on the toilet paper  .  IF YOU HAD POLYPS REMOVED DURING THE ENDOSCOPY:  Eat a soft diet IF YOU HAVE NAUSEA, BLOATING, ABDOMINAL PAIN, OR VOMITING.    FINDING OUT THE RESULTS OF YOUR TEST Not all test results are available during your visit. DR. Oneida Alar WILL CALL YOU WITHIN 7 DAYS OF YOUR PROCEDUE WITH YOUR RESULTS. Do not assume everything is normal if you have not heard from DR. Labradford Schnitker IN ONE WEEK, CALL HER OFFICE AT [phone].  SEEK IMMEDIATE MEDICAL ATTENTION AND CALL THE OFFICE: [phone] IF:  You have more than a spotting of blood in your stool.   Your belly is swollen (abdominal distention).   You are nauseated or vomiting.   You have a temperature over 101F.   You have abdominal pain or discomfort that is severe or gets worse throughout the day.  Polyps, Colon  A polyp is extra tissue that grows inside your body. Colon polyps grow in the large intestine. The large intestine, also called the colon,  is part of your digestive system. It is a long, hollow tube at the end of your digestive tract where your body makes and stores stool. Most polyps are not dangerous. They are benign. This means they are not cancerous. But over time, some types of polyps can turn into cancer. Polyps that are smaller than a pea are usually not harmful. But larger polyps could someday become or may already be cancerous. To be safe, doctors remove all polyps and test them.   WHO GETS POLYPS? Anyone can get polyps, but certain people are more likely than others. You may have a greater chance of getting polyps if:  You  are over 50.   You have had polyps before.   Someone in your family has had polyps.   Someone in your family has had cancer of the large intestine.   Find out if someone in your family has had polyps. You may also be more likely to get polyps if you:   Eat a lot of fatty foods   Smoke   Drink alcohol   Do not exercise  Eat too much   PREVENTION There is not one sure way to prevent polyps. You might be able to lower your risk of getting them if you:  Eat more fruits and vegetables and less fatty food.   Do not smoke.   Avoid alcohol.   Exercise every day.   Lose weight if you are overweight.   Eating more calcium and folate can also lower your risk of getting polyps. Some foods that are rich in calcium are milk, cheese, and broccoli. Some foods that are rich in folate are chickpeas, kidney beans, and spinach.    Gastritis  Gastritis is an inflammation (the body's way of reacting to injury and/or infection) of the stomach. It is often caused by viral or bacterial (germ) infections. It can also be caused BY ASPIRIN, BC/GOODY POWDER'S, (IBUPROFEN) MOTRIN, OR ALEVE (NAPROXEN), chemicals (including alcohol), SPICY FOODS, and medications. This illness may be associated with generalized malaise (feeling tired, not well), UPPER ABDOMINAL STOMACH cramps, and fever. One common bacterial cause of gastritis is an organism known as H. Pylori. This can be treated with antibiotics.    High-Fiber Diet A high-fiber diet changes your normal diet to include more whole grains, legumes, fruits, and vegetables. Changes in the diet involve replacing refined carbohydrates with unrefined foods. The calorie level of the diet is essentially unchanged. The Dietary Reference Intake (recommended amount) for adult males is 38 grams per day. For adult females, it is 25 grams per day. Pregnant and lactating women should consume 28 grams of fiber per day. Fiber is the intact part of a plant that is not broken  down during digestion. Functional fiber is fiber that has been isolated from the plant to provide a beneficial effect in the body. PURPOSE  Increase stool bulk.   Ease and regulate bowel movements.   Lower cholesterol.  INDICATIONS THAT YOU NEED MORE FIBER  Constipation and hemorrhoids.   Uncomplicated diverticulosis (intestine condition) and irritable bowel syndrome.   Weight management.   As a protective measure against hardening of the arteries (atherosclerosis), diabetes, and cancer.   GUIDELINES FOR INCREASING FIBER IN THE DIET  Start adding fiber to the diet slowly. A gradual increase of about 5 more grams (2 slices of whole-wheat bread, 2 servings of most fruits or vegetables, or 1 bowl of high-fiber cereal) per day is best. Too rapid an increase in fiber may result  in constipation, flatulence, and bloating.   Drink enough water and fluids to keep your urine clear or pale yellow. Water, juice, or caffeine-free drinks are recommended. Not drinking enough fluid may cause constipation.   Eat a variety of high-fiber foods rather than one type of fiber.   Try to increase your intake of fiber through using high-fiber foods rather than fiber pills or supplements that contain small amounts of fiber.   The goal is to change the types of food eaten. Do not supplement your present diet with high-fiber foods, but replace foods in your present diet.  INCLUDE A VARIETY OF FIBER SOURCES  Replace refined and processed grains with whole grains, canned fruits with fresh fruits, and incorporate other fiber sources. White rice, white breads, and most bakery goods contain little or no fiber.   Brown whole-grain rice, buckwheat oats, and many fruits and vegetables are all good sources of fiber. These include: broccoli, Brussels sprouts, cabbage, cauliflower, beets, sweet potatoes, white potatoes (skin on), carrots, tomatoes, eggplant, squash, berries, fresh fruits, and dried fruits.   Cereals  appear to be the richest source of fiber. Cereal fiber is found in whole grains and bran. Bran is the fiber-rich outer coat of cereal grain, which is largely removed in refining. In whole-grain cereals, the bran remains. In breakfast cereals, the largest amount of fiber is found in those with "bran" in their names. The fiber content is sometimes indicated on the label.   You may need to include additional fruits and vegetables each day.   In baking, for 1 cup white flour, you may use the following substitutions:   1 cup whole-wheat flour minus 2 tablespoons.   1/2 cup white flour plus 1/2 cup whole-wheat flour.   Low-Fat Diet BREADS, CEREALS, PASTA, RICE, DRIED PEAS, AND BEANS These products are high in carbohydrates and most are low in fat. Therefore, they can be increased in the diet as substitutes for fatty foods. They too, however, contain calories and should not be eaten in excess. Cereals can be eaten for snacks as well as for breakfast.  Include foods that contain fiber (fruits, vegetables, whole grains, and legumes). Research shows that fiber may lower blood cholesterol levels, especially the water-soluble fiber found in fruits, vegetables, oat products, and legumes. FRUITS AND VEGETABLES It is good to eat fruits and vegetables. Besides being sources of fiber, both are rich in vitamins and some minerals. They help you get the daily allowances of these nutrients. Fruits and vegetables can be used for snacks and desserts. MEATS Limit lean meat, chicken, [place], and fish to no more than 6 ounces per day. Beef, Pork, and Lamb Use lean cuts of beef, pork, and lamb. Lean cuts include:  Extra-lean ground beef.  Arm roast.  Sirloin tip.  Center-cut ham.  Round steak.  Loin chops.  Rump roast.  Tenderloin.  Trim all fat off the outside of meats before cooking. It is not necessary to severely decrease the intake of red meat, but lean choices should be made. Lean meat is rich in protein and  contains a highly absorbable form of iron. Premenopausal women, in particular, should avoid reducing lean red meat because this could increase the risk for low red blood cells (iron-deficiency anemia). The organ meats, such as liver, sweetbreads, kidneys, and brain are very rich in cholesterol. They should be limited. Chicken and [place] These are good sources of protein. The fat of poultry can be reduced by removing the skin and underlying fat layers  before cooking. Chicken and [place] can be substituted for lean red meat in the diet. Poultry should not be fried or covered with high-fat sauces. Fish and Shellfish Fish is a good source of protein. Shellfish contain cholesterol, but they usually are low in saturated fatty acids. The preparation of fish is important. Like chicken and [place], they should not be fried or covered with high-fat sauces. EGGS Egg whites contain no fat or cholesterol. They can be eaten often. Try 1 to 2 egg whites instead of whole eggs in recipes or use egg substitutes that do not contain yolk. MILK AND DAIRY PRODUCTS Use skim or 1% milk instead of 2% or whole milk. Decrease whole milk, natural, and processed cheeses. Use nonfat or low-fat (2%) cottage cheese or low-fat cheeses made from vegetable oils. Choose nonfat or low-fat (1 to 2%) yogurt. Experiment with evaporated skim milk in recipes that call for heavy cream. Substitute low-fat yogurt or low-fat cottage cheese for sour cream in dips and salad dressings. Have at least 2 servings of low-fat dairy products, such as 2 glasses of skim (or 1%) milk each day to help get your daily calcium intake.  FATS AND OILS Reduce the total intake of fats, especially saturated fat. Butterfat, lard, and beef fats are high in saturated fat and cholesterol. These should be avoided as much as possible. Vegetable fats do not contain cholesterol, but certain vegetable fats, such as coconut oil, palm oil, and palm kernel oil are very high in  saturated fats. These should be limited. These fats are often used in bakery goods, processed foods, popcorn, oils, and nondairy creamers. Vegetable shortenings and some peanut butters contain hydrogenated oils, which are also saturated fats. Read the labels on these foods and check for saturated vegetable oils. Unsaturated vegetable oils and fats do not raise blood cholesterol. However, they should be limited because they are fats and are high in calories. Total fat should still be limited to 30% of your daily caloric intake. Desirable liquid vegetable oils are corn oil, cottonseed oil, olive oil, canola oil, safflower oil, soybean oil, and sunflower oil. Peanut oil is not as good, but small amounts are acceptable. Buy a heart-healthy tub margarine that has no partially hydrogenated oils in the ingredients. Mayonnaise and salad dressings often are made from unsaturated fats, but they should also be limited because of their high calorie and fat content. Seeds, nuts, peanut butter, olives, and avocados are high in fat, but the fat is mainly the unsaturated type. These foods should be limited mainly to avoid excess calories and fat. OTHER EATING TIPS Snacks  Most sweets should be limited as snacks. They tend to be rich in calories and fats, and their caloric content outweighs their nutritional value. Some good choices in snacks are graham crackers, melba toast, soda crackers, bagels (no egg), English muffins, fruits, and vegetables. These snacks are preferable to snack crackers, [place] fries, and chips. Popcorn should be air-popped or cooked in small amounts of liquid vegetable oil. Desserts Eat fruit, low-fat yogurt, and fruit ices. AVOID pastries, cake, and cookies. Sherbet, angel food cake, gelatin dessert, frozen low-fat yogurt, or other frozen products that do not contain saturated fat (pure fruit juice bars, frozen ice pops) are also acceptable.  COOKING METHODS Choose those methods that use little or  no fat. They include: Poaching.  Braising.  Steaming.  Grilling.  Baking.  Stir-frying.  Broiling.  Microwaving.  Foods can be cooked in a nonstick pan without added fat, or use  a nonfat cooking spray in regular cookware. Limit fried foods and avoid frying in saturated fat. Add moisture to lean meats by using water, broth, cooking wines, and other nonfat or low-fat sauces along with the cooking methods mentioned above. Soups and stews should be chilled after cooking. The fat that forms on top after a few hours in the refrigerator should be skimmed off. When preparing meals, avoid using excess salt. Salt can contribute to raising blood pressure in some people. EATING AWAY FROM HOME Order entres, potatoes, and vegetables without sauces or butter. When meat exceeds the size of a deck of cards (3 to 4 ounces), the rest can be taken home for another meal. Choose vegetable or fruit salads and ask for low-calorie salad dressings to be served on the side. Use dressings sparingly. Limit high-fat toppings, such as bacon, crumbled eggs, cheese, sunflower seeds, and olives. Ask for heart-healthy tub margarine instead of butter.   Hemorrhoids Hemorrhoids are dilated (enlarged) veins around the rectum. Sometimes clots will form in the veins. This makes them swollen and painful. These are called thrombosed hemorrhoids. Causes of hemorrhoids include:  Constipation.   Straining to have a bowel movement.   HEAVY LIFTING HOME CARE INSTRUCTIONS  Eat a well balanced diet and drink 6 to 8 glasses of water every day to avoid constipation. You may also use a bulk laxative.   Avoid straining to have bowel movements.   Keep anal area dry and clean.   Do not use a donut shaped pillow or sit on the toilet for long periods. This increases blood pooling and pain.   Move your bowels when your body has the urge; this will require less straining and will decrease pain and pressure.

## 2018-01-26 NOTE — H&P (Signed)
Primary Care Physician:  Janora Norlander, DO Primary Gastroenterologist:  Dr. Oneida Alar  Pre-Procedure History & Physical: HPI:  Janice Galvan is a 42 y.o. female here for Anemia-FE IN [place].  Past Medical History:  Diagnosis Date  . Anemia   . HSV-1 (herpes simplex virus 1) infection     Past Surgical History:  Procedure Laterality Date  . ANTERIOR CRUCIATE LIGAMENT REPAIR Left 2001  . ENDOMETRIAL ABLATION  02/2017   [place], [place]    Prior to Admission medications   Not on File    Allergies as of 12/03/2017  . (No Known Allergies)    Family History  Problem Relation Age of Onset  . Cancer Father        lung  . Schizophrenia Brother   . Diabetes Brother   . Hypertension Brother   . Cancer Maternal Grandmother   . Colon cancer Neg Hx   . Colon polyps Neg Hx     Social History   Socioeconomic History  . Marital status: Single    Spouse name: Not on file  . Number of children: Not on file  . Years of education: Not on file  . Highest education level: Not on file  Occupational History  . Not on file  Social Needs  . Financial resource strain: Not on file  . Food insecurity:    Worry: Not on file    Inability: Not on file  . Transportation needs:    Medical: Not on file    Non-medical: Not on file  Tobacco Use  . Smoking status: Never Smoker  . Smokeless tobacco: Never Used  Substance and Sexual Activity  . Alcohol use: Yes    Comment: occ  . Drug use: No  . Sexual activity: Not Currently    Birth control/protection: None  Lifestyle  . Physical activity:    Days per week: Not on file    Minutes per session: Not on file  . Stress: Not on file  Relationships  . Social connections:    Talks on phone: Not on file    Gets together: Not on file    Attends religious service: Not on file    Active member of club or organization: Not on file    Attends meetings of clubs or organizations: Not on file    Relationship status: Not on  file  . Intimate partner violence:    Fear of current or ex partner: Not on file    Emotionally abused: Not on file    Physically abused: Not on file    Forced sexual activity: Not on file  Other Topics Concern  . Not on file  Social History Narrative  . Not on file    Review of Systems: See HPI, otherwise negative ROS   Physical Exam: BP 115/72   Temp 98.4 F (36.9 C) (Oral)   Resp (!) 22   Ht 5' 5.5" (1.664 m)   Wt 99.8 kg   LMP 12/27/2017   SpO2 100%   BMI 36.05 kg/m  General:   Alert,  pleasant and cooperative in NAD Head:  Normocephalic and atraumatic. Neck:  Supple; Lungs:  Clear throughout to auscultation.    Heart:  Regular rate and rhythm. Abdomen:  Soft, nontender and nondistended. Normal bowel sounds, without guarding, and without rebound.   Neurologic:  Alert and  oriented x4;  grossly normal neurologically.  Impression/Plan:     Anemia-FE IN SETTING OF HEAVY CYCLES  PLAN:  1.  TCS/EGD TODAY. DISCUSSED PROCEDURE, BENEFITS, & RISKS: < 1% chance of medication reaction, bleeding, perforation, or rupture of spleen/liver.

## 2018-01-26 NOTE — Telephone Encounter (Signed)
Received call from Endo. Needs 4 month follow-up but needs to be on a Monday.

## 2018-01-26 NOTE — Op Note (Signed)
[facility] Patient Name: Janice Galvan Procedure Date: 01/26/2018 11:04 AM MRN: [id number] Date of Birth: [birth year] Attending MD: Barney Drain MD, MD CSN: [id number] Age: 42 Admit Type: Outpatient Procedure:                Upper GI endoscopy WITH COLD FORCEPS BIOPSY Indications:              Iron deficiency anemia Providers:                Barney Drain MD, MD, Gerome Sam, RN, Aram Candela Referring MD:             Koleen Distance. Arther Dames, MD Medicines:                Meperidine 25 mg IV, Midazolam 2 mg IV Complications:            No immediate complications. Estimated Blood Loss:     Estimated blood loss was minimal. Procedure:                After obtaining informed consent, the endoscope was                            passed under direct vision. Throughout the                            procedure, the patient's blood pressure, pulse, and                            oxygen saturations were monitored continuously. The                            [id number]-H190 ([id number]) scope was introduced through the                            mouth, and advanced to the second part of duodenum.                            The upper GI endoscopy was accomplished without                            difficulty. The patient tolerated the procedure                            well. Scope In: 2:20:06 PM Scope Out: 2:28:39 PM Total Procedure Duration: 0 hours 8 minutes 33 seconds  Findings:      The examined esophagus was normal.      A few small sessile polyps with no bleeding and no stigmata of recent       bleeding were found in the gastric body. The polyp was removed with a       cold biopsy forceps. Resection and retrieval were complete. Biopsies       were taken with a cold forceps for histology. 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      The second  portion of the duodenum was normal. Biopsies(4: BTL 1) for    histology were taken with a cold forceps for evaluation of celiac       disease.      The duodenal bulb was normal. Biopsies(2: BTL 2) for histology were       taken with a cold forceps for evaluation of celiac disease. Impression:               - NO OBVIOUS SOURCE FOR ANEMIA IDENTIFIED                           - A few gastric polyps. Resected and retrieved.                            Biopsied. Moderate Sedation:      Moderate (conscious) sedation was administered by the endoscopy nurse       and supervised by the endoscopist. The following parameters were       monitored: oxygen saturation, heart rate, blood pressure, and response       to care. Total physician intraservice time was 61 minutes. Recommendation:           - Patient has a contact number available for                            emergencies. The signs and symptoms of potential                            delayed complications were discussed with the                            patient. Return to normal activities tomorrow.                            Written discharge instructions were provided to the                            patient.                           - High fiber diet.                           - Continue present medications.                           - Await pathology results.                           - Return to my office in 4 months. Procedure Code(s):        --- Professional ---                           [phone], Esophagogastroduodenoscopy, flexible,                            transoral; with biopsy, single or multiple  30865, Moderate sedation; each additional 15                            minutes intraservice time                           99153, Moderate sedation; each additional 15                            minutes intraservice time                           99153, Moderate sedation; each additional 15                            minutes intraservice time                            G0500, Moderate sedation services provided by the                            same physician or other qualified health care                            professional performing a gastrointestinal                            endoscopic service that sedation supports,                            requiring the presence of an independent trained                            observer to assist in the monitoring of the                            patient's level of consciousness and physiological                            status; initial 15 minutes of intra-service time;                            patient age 1 years or older (additional time may                            be reported with [phone], as appropriate) Diagnosis Code(s):        --- Professional ---                           K31.7, Polyp of stomach and duodenum                           D50.9, Iron deficiency anemia, unspecified CPT copyright 2018 American Medical Association. All rights reserved. The codes documented in this report are preliminary and upon coder review may  be revised to meet current compliance  requirements. Barney Drain, MD Barney Drain MD, MD 01/26/2018 2:42:59 PM This report has been signed electronically. Number of Addenda: 0

## 2018-01-26 NOTE — Op Note (Signed)
[facility] Patient Name: Janice Galvan Procedure Date: 01/26/2018 11:05 AM MRN: [id number] Date of Birth: 03-03-1975 Attending MD: Barney Drain MD, MD CSN: [id number] Age: 42 Admit Type: Outpatient Procedure:                Colonoscopy WITH COLD SNARE POLYPECTOMY Indications:              Iron deficiency anemia Providers:                Barney Drain MD, MD, Gerome Sam, RN, Aram Candela Referring MD:             Koleen Distance. Lajuana Ripple, MD Zoila Shutter, MD Medicines:                Meperidine 75 mg IV, Midazolam 4 mg IV Complications:            No immediate complications. Estimated Blood Loss:     Estimated blood loss was minimal. Procedure:                Pre-Anesthesia Assessment:                           - Prior to the procedure, a History and Physical                            was performed, and patient medications and                            allergies were reviewed. The patient's tolerance of                            previous anesthesia was also reviewed. The risks                            and benefits of the procedure and the sedation                            options and risks were discussed with the patient.                            All questions were answered, and informed consent                            was obtained. Prior Anticoagulants: The patient has                            taken no previous anticoagulant or antiplatelet                            agents. ASA Grade Assessment: I - A normal, healthy                            patient. After reviewing the risks and benefits,  the patient was deemed in satisfactory condition to                            undergo the procedure. After obtaining informed                            consent, the colonoscope was passed under direct                            vision. Throughout the procedure, the patient's                            blood pressure,  pulse, and oxygen saturations were                            monitored continuously. The PCF-[id number] ([id number])                            was introduced through the anus and advanced to the                            5 cm into the ileum. The colonoscopy was extremely                            difficult due to a redundant colon and significant                            looping. Successful completion of the procedure was                            aided by increasing the dose of sedation                            medication, changing the patient to a supine                            position, using manual pressure, straightening and                            shortening the scope to obtain bowel loop reduction                            and COLOWRAP. The patient tolerated the procedure                            well. The quality of the bowel preparation was good. Scope In: 1:40:16 PM Scope Out: 2:11:48 PM Scope Withdrawal Time: 0 hours 9 minutes 57 seconds  Total Procedure Duration: 0 hours 31 minutes 32 seconds  Findings:      The terminal ileum appeared normal.      Two sessile polyps were found in the sigmoid colon. The polyps were 3 to       4 mm in size. These polyps were removed with a cold snare. Resection and  retrieval were complete.      The recto-sigmoid colon, sigmoid colon, descending colon and splenic       flexure revealed significantly excessive looping.      Internal hemorrhoids were found. The hemorrhoids were small. Impression:               - The examined portion of the ileum was normal.                           - Two 3 to 4 mm polyps in the sigmoid colon,                            removed with a cold snare. Resected and retrieved.                           - There was significant looping of the colon.                           - Internal hemorrhoids. Moderate Sedation:      Moderate (conscious) sedation was administered by the endoscopy nurse       and  supervised by the endoscopist. The following parameters were       monitored: oxygen saturation, heart rate, blood pressure, and response       to care. Total physician intraservice time was 61 minutes. Recommendation:           - Patient has a contact number available for                            emergencies. The signs and symptoms of potential                            delayed complications were discussed with the                            patient. Return to normal activities tomorrow.                            Written discharge instructions were provided to the                            patient.                           - High fiber diet.                           - Continue present medications.                           - Await pathology results.                           - Repeat colonoscopy in 10 years for surveillance.                           - Return to my office in 4  months. Procedure Code(s):        --- Professional ---                           [phone], Colonoscopy, flexible; with removal of                            tumor(s), polyp(s), or other lesion(s) by snare                            technique                           99153, Moderate sedation; each additional 15                            minutes intraservice time                           99153, Moderate sedation; each additional 15                            minutes intraservice time                           99153, Moderate sedation; each additional 15                            minutes intraservice time                           G0500, Moderate sedation services provided by the                            same physician or other qualified health care                            professional performing a gastrointestinal                            endoscopic service that sedation supports,                            requiring the presence of an independent trained                            observer to assist  in the monitoring of the                            patient's level of consciousness and physiological                            status; initial 15 minutes of intra-service time;                            patient age 48 years or older (additional time 53  be reported with [phone], as appropriate) Diagnosis Code(s):        --- Professional ---                           K64.8, Other hemorrhoids                           D12.5, Benign neoplasm of sigmoid colon                           D50.9, Iron deficiency anemia, unspecified CPT copyright 2018 American Medical Association. All rights reserved. The codes documented in this report are preliminary and upon coder review may  be revised to meet current compliance requirements. Barney Drain, MD Barney Drain MD, MD 01/26/2018 2:38:05 PM This report has been signed electronically. Number of Addenda: 0

## 2018-01-27 ENCOUNTER — Encounter: Payer: Self-pay | Admitting: Gastroenterology

## 2018-01-27 NOTE — Telephone Encounter (Signed)
RESCHEDULED TO A Monday APPOINTMENT AND MAILED HER A LETTER

## 2018-02-02 ENCOUNTER — Encounter (HOSPITAL_COMMUNITY): Payer: Self-pay | Admitting: Gastroenterology

## 2018-02-06 NOTE — Progress Notes (Signed)
PT wants to know if she needs any meds for gastritis?

## 2018-02-06 NOTE — Progress Notes (Signed)
PT is aware.

## 2018-02-09 NOTE — Progress Notes (Signed)
SCHEDULED AND ON RECALL  °

## 2018-02-11 NOTE — Progress Notes (Signed)
PT is aware.

## 2018-04-24 ENCOUNTER — Other Ambulatory Visit (HOSPITAL_COMMUNITY): Payer: Self-pay | Admitting: Nurse Practitioner

## 2018-05-04 ENCOUNTER — Other Ambulatory Visit (HOSPITAL_COMMUNITY): Payer: BLUE CROSS/BLUE SHIELD

## 2018-05-11 ENCOUNTER — Ambulatory Visit (HOSPITAL_COMMUNITY): Payer: BLUE CROSS/BLUE SHIELD | Admitting: Nurse Practitioner

## 2018-05-25 ENCOUNTER — Encounter: Payer: Self-pay | Admitting: Gastroenterology

## 2018-05-25 ENCOUNTER — Ambulatory Visit (INDEPENDENT_AMBULATORY_CARE_PROVIDER_SITE_OTHER): Payer: BLUE CROSS/BLUE SHIELD | Admitting: Gastroenterology

## 2018-05-25 ENCOUNTER — Other Ambulatory Visit: Payer: Self-pay

## 2018-05-25 DIAGNOSIS — D500 Iron deficiency anemia secondary to blood loss (chronic): Secondary | ICD-10-CM

## 2018-05-25 NOTE — Patient Instructions (Signed)
I am glad you are doing well! Please let us know if any concerns in the new few months.  We will see you back in 6 months to ensure all is stable.   I enjoyed seeing you again today! As you know, I value our relationship and want to provide genuine, compassionate, and quality care. I welcome your feedback. If you receive a survey regarding your visit,  I greatly appreciate you taking time to fill this out. See you next time!  Annitta Needs, PhD, ANP-BC [facility]

## 2018-05-25 NOTE — Progress Notes (Signed)
Primary Care Physician:  Janora Norlander, DO  Primary GI: Dr. Oneida Alar   Virtual Visit via Telephone Note Due to COVID-19, visit is conducted virtually and was requested by patient.   I connected with Janice Galvan on 05/25/18 at 11:00 AM EDT by telephone and verified that I am speaking with the correct person using two identifiers.   I discussed the limitations, risks, security and privacy concerns of performing an evaluation and management service by telephone and the availability of in person appointments. I also discussed with the patient that there may be a patient responsible charge related to this service. The patient expressed understanding and agreed to proceed.  Chief Complaint  Patient presents with  . Anemia    TCS f/u-doing ok     History of Present Illness: Very pleasant 43 year old female with history of IDA, followed by Hematology. Underwent diagnostic TCS/EGD due to IDA. Prior history of heavy vaginal bleeding, s/p endometrial ablation Feb 2019. Colonoscopy with two 3-4 mm polyps in sigmoid colon (hyperplastic), internal hemorrhoids. EGD with few gastric polyps, fundic gland. Gastritis, normal small bowel biopsies. Next colonoscopy age 39-50.   Needs to reschedule appt with hematology. No abdominal pain, no N/V. Too good of an appetite. Prescribed iron but not taking it orally. No overt GI bleeding. No reflux. No GI concerns. No further vaginal bleeding.  Past Medical History:  Diagnosis Date  . Anemia   . HSV-1 (herpes simplex virus 1) infection      Past Surgical History:  Procedure Laterality Date  . ANTERIOR CRUCIATE LIGAMENT REPAIR Left 2001  . BIOPSY  01/26/2018   Procedure: BIOPSY;  Surgeon: Danie Binder, MD;  [facility];  Service: Endoscopy;;  gastric and duodenuem  . COLONOSCOPY N/A 01/26/2018   two 3-4 mm polyps in sigmoid colon (hyperplastic), internal hemorrhoids. Next age 52-50  . ENDOMETRIAL ABLATION  02/2017   [place], [place]  . ESOPHAGOGASTRODUODENOSCOPY N/A 01/26/2018   EGD with few gastric polyps, fundic gland. Gastritis, normal small bowel biopsies  . POLYPECTOMY  01/26/2018   Procedure: POLYPECTOMY;  Surgeon: Danie Binder, MD;  [facility];  Service: Endoscopy;;  sigmoid     No outpatient medications have been marked as taking for the 05/25/18 encounter (Office Visit) with Annitta Needs, NP.      Review of Systems: Gen: Denies fever, chills, anorexia. Denies fatigue, weakness, weight loss.  CV: Denies chest pain, palpitations, syncope, peripheral edema, and claudication. Resp: Denies dyspnea at rest, cough, wheezing, coughing up blood, and pleurisy. GI: see HPI Derm: Denies rash, itching, dry skin Psych: Denies depression, anxiety, memory loss, confusion. No homicidal or suicidal ideation.  Heme: Denies bruising, bleeding, and enlarged lymph nodes.  Observations/Objective: No distress. Video call with patient pleasant, cooperative, alert and oriented, well-dressed.   Assessment and Plan: 43 year old female with history of IDA, s/p colonoscopy/EGD to rule out occult GI source. History of heavy vaginal bleeding past, resolving after endometrial ablation. No concerning findings on diagnostic endoscopic procedures. Will need next colonoscopy age 53-50. No concerning upper or lower GI signs/symptoms. Will see her again in 6 months to ensure all is stable. Doubt will need capsule study; however, I did discuss this with her if she has worsening IDA. Continue follow-up with Hematology.   Follow Up Instructions:    I discussed the assessment and treatment plan with the patient. The patient was provided an opportunity to ask questions and all were answered. The patient agreed with  the plan and demonstrated an understanding of the instructions.   The patient was advised to call back or seek an in-person evaluation if the symptoms worsen or if the condition fails to improve as anticipated.   I provided 10 minutes of face-to-face time during this encounter via video call.  Annitta Needs, PhD, ANP-BC [facility]

## 2018-05-27 NOTE — Progress Notes (Signed)
CC'ED TO PCP 

## 2018-05-28 ENCOUNTER — Ambulatory Visit: Payer: BLUE CROSS/BLUE SHIELD | Admitting: Gastroenterology

## 2018-09-21 ENCOUNTER — Other Ambulatory Visit (HOSPITAL_COMMUNITY): Payer: Self-pay | Admitting: Family Medicine

## 2018-09-21 DIAGNOSIS — Z1231 Encounter for screening mammogram for malignant neoplasm of breast: Secondary | ICD-10-CM

## 2018-10-27 ENCOUNTER — Ambulatory Visit: Payer: BLUE CROSS/BLUE SHIELD | Admitting: Family Medicine

## 2018-11-09 ENCOUNTER — Other Ambulatory Visit: Payer: Self-pay

## 2018-11-09 ENCOUNTER — Ambulatory Visit (HOSPITAL_COMMUNITY)
Admission: RE | Admit: 2018-11-09 | Discharge: 2018-11-09 | Disposition: A | Discharge status: Home / Self Care | Payer: BC Managed Care – PPO | Source: Ambulatory Visit | Attending: Family Medicine | Admitting: Family Medicine

## 2018-11-09 DIAGNOSIS — Z1231 Encounter for screening mammogram for malignant neoplasm of breast: Secondary | ICD-10-CM | POA: Diagnosis not present

## 2018-11-20 ENCOUNTER — Other Ambulatory Visit: Payer: Self-pay

## 2018-11-23 ENCOUNTER — Ambulatory Visit (INDEPENDENT_AMBULATORY_CARE_PROVIDER_SITE_OTHER): Payer: BC Managed Care – PPO | Admitting: Family Medicine

## 2018-11-23 ENCOUNTER — Encounter: Payer: Self-pay | Admitting: Family Medicine

## 2018-11-23 VITALS — BP 117/78 | HR 89 | Temp 97.8°F | Ht 66.0 in | Wt 220.0 lb

## 2018-11-23 DIAGNOSIS — N760 Acute vaginitis: Secondary | ICD-10-CM

## 2018-11-23 DIAGNOSIS — Z2821 Immunization not carried out because of patient refusal: Secondary | ICD-10-CM

## 2018-11-23 DIAGNOSIS — D509 Iron deficiency anemia, unspecified: Secondary | ICD-10-CM

## 2018-11-23 DIAGNOSIS — Z124 Encounter for screening for malignant neoplasm of cervix: Secondary | ICD-10-CM | POA: Diagnosis not present

## 2018-11-23 DIAGNOSIS — Z0000 Encounter for general adult medical examination without abnormal findings: Secondary | ICD-10-CM | POA: Diagnosis not present

## 2018-11-23 DIAGNOSIS — B9689 Other specified bacterial agents as the cause of diseases classified elsewhere: Secondary | ICD-10-CM

## 2018-11-23 DIAGNOSIS — Z01419 Encounter for gynecological examination (general) (routine) without abnormal findings: Secondary | ICD-10-CM

## 2018-11-23 LAB — WET PREP FOR TRICH, YEAST, CLUE
Clue Cell Exam: POSITIVE — AB
Trichomonas Exam: NEGATIVE
Yeast Exam: NEGATIVE

## 2018-11-23 LAB — BAYER DCA HB A1C WAIVED: HB A1C (BAYER DCA - WAIVED): 4.6 % (ref <7.0)

## 2018-11-23 MED ORDER — METRONIDAZOLE 500 MG PO TABS: 500.0000 mg | ORAL_TABLET | Freq: Two times a day (BID) | ORAL | 0 refills | Status: AC

## 2018-11-23 NOTE — Patient Instructions (Addendum)
You had labs performed today.  You will be contacted with the results of the labs once they are available, usually in the next 3 business days for routine lab work.  If you have an active my chart account, they will be released to your MyChart.  If you prefer to have these labs released to you via telephone, please let us know.  If you had a pap smear or biopsy performed, expect to be contacted in about 7-10 days.  Bacterial Vaginosis  Bacterial vaginosis is a vaginal infection that occurs when the normal balance of bacteria in the vagina is disrupted. It results from an overgrowth of certain bacteria. This is the most common vaginal infection among women ages 50-44. Because bacterial vaginosis increases your risk for STIs (sexually transmitted infections), getting treated can help reduce your risk for chlamydia, gonorrhea, herpes, and HIV (human immunodeficiency virus). Treatment is also important for preventing complications in pregnant women, because this condition can cause an early (premature) delivery. What are the causes? This condition is caused by an increase in harmful bacteria that are normally present in small amounts in the vagina. However, the reason that the condition develops is not fully understood. What increases the risk? The following factors may make you more likely to develop this condition:  Having a new sexual partner or multiple sexual partners.  Having unprotected sex.  Douching.  Having an intrauterine device (IUD).  Smoking.  Drug and alcohol abuse.  Taking certain antibiotic medicines.  Being pregnant. You cannot get bacterial vaginosis from toilet seats, bedding, swimming pools, or contact with objects around you. What are the signs or symptoms? Symptoms of this condition include:  Grey or white vaginal discharge. The discharge can also be watery or foamy.  A fish-like odor with discharge, especially after sexual intercourse or during  menstruation.  Itching in and around the vagina.  Burning or pain with urination. Some women with bacterial vaginosis have no signs or symptoms. How is this diagnosed? This condition is diagnosed based on:  Your medical history.  A physical exam of the vagina.  Testing a sample of vaginal fluid under a microscope to look for a large amount of bad bacteria or abnormal cells. Your health care provider may use a cotton swab or a small wooden spatula to collect the sample. How is this treated? This condition is treated with antibiotics. These may be given as a pill, a vaginal cream, or a medicine that is put into the vagina (suppository). If the condition comes back after treatment, a second round of antibiotics may be needed. Follow these instructions at home: Medicines  Take over-the-counter and prescription medicines only as told by your health care provider.  Take or use your antibiotic as told by your health care provider. Do not stop taking or using the antibiotic even if you start to feel better. General instructions  If you have a female sexual partner, tell her that you have a vaginal infection. She should see her health care provider and be treated if she has symptoms. If you have a female sexual partner, he does not need treatment.  During treatment: ? Avoid sexual activity until you finish treatment. ? Do not douche. ? Avoid alcohol as directed by your health care provider. ? Avoid breastfeeding as directed by your health care provider.  Drink enough water and fluids to keep your urine clear or pale yellow.  Keep the area around your vagina and rectum clean. ? Wash the area daily with  warm water. ? Wipe yourself from front to back after using the toilet.  Keep all follow-up visits as told by your health care provider. This is important. How is this prevented?  Do not douche.  Wash the outside of your vagina with warm water only.  Use protection when having sex. This  includes latex condoms and dental dams.  Limit how many sexual partners you have. To help prevent bacterial vaginosis, it is best to have sex with just one partner (monogamous).  Make sure you and your sexual partner are tested for STIs.  Wear cotton or cotton-lined underwear.  Avoid wearing tight pants and pantyhose, especially during summer.  Limit the amount of alcohol that you drink.  Do not use any products that contain nicotine or tobacco, such as cigarettes and e-cigarettes. If you need help quitting, ask your health care provider.  Do not use illegal drugs. Where to find more information  Centers for Disease Control and Prevention: AppraiserFraud.fi  American Sexual Health Association (ASHA): www.ashastd.org  U.S. Department of Health and Financial controller, Office on Women's Health: DustingSprays.pl or SecuritiesCard.it Contact a health care provider if:  Your symptoms do not improve, even after treatment.  You have more discharge or pain when urinating.  You have a fever.  You have pain in your abdomen.  You have pain during sex.  You have vaginal bleeding between periods. Summary  Bacterial vaginosis is a vaginal infection that occurs when the normal balance of bacteria in the vagina is disrupted.  Because bacterial vaginosis increases your risk for STIs (sexually transmitted infections), getting treated can help reduce your risk for chlamydia, gonorrhea, herpes, and HIV (human immunodeficiency virus). Treatment is also important for preventing complications in pregnant women, because the condition can cause an early (premature) delivery.  This condition is treated with antibiotic medicines. These may be given as a pill, a vaginal cream, or a medicine that is put into the vagina (suppository). This information is not intended to replace advice given to you by your health care provider. Make sure you discuss any questions  you have with your health care provider. Document Released: 01/14/2005 Document Revised: 12/27/2016 Document Reviewed: 09/30/2015 Elsevier Patient Education  2020 [employer].

## 2018-11-23 NOTE — Progress Notes (Signed)
Janice Galvan is a 43 y.o. female presents to office today for annual physical exam examination.    Concerns today include: 1.  Edema Continues to have lower extremity swelling.  Swelling seems to be pretty stable throughout the day.  She has been using compression hose but has not noticed a great deal of improvement with the swelling in the legs.  Denies any pain, skin breakdown or erythema.  She maintains a balanced diet but is not necessarily focused on low-salt diet.  Occupation: Insurance underwriter, Marital status: Single, Substance use: None Diet: As above Last mammogram: This year Last pap smear: Needs Refills needed today: N/A Immunizations needed: Up-to-date  Past Medical History:  Diagnosis Date  . Anemia   . HSV-1 (herpes simplex virus 1) infection    Social History   Socioeconomic History  . Marital status: Single    Spouse name: Not on file  . Number of children: Not on file  . Years of education: Not on file  . Highest education level: Not on file  Occupational History  . Not on file  Social Needs  . Financial resource strain: Not on file  . Food insecurity    Worry: Not on file    Inability: Not on file  . Transportation needs    Medical: Not on file    Non-medical: Not on file  Tobacco Use  . Smoking status: Never Smoker  . Smokeless tobacco: Never Used  Substance and Sexual Activity  . Alcohol use: Yes    Comment: occ  . Drug use: No  . Sexual activity: Not Currently    Birth control/protection: None  Lifestyle  . Physical activity    Days per week: Not on file    Minutes per session: Not on file  . Stress: Not on file  Relationships  . Social Herbalist on phone: Not on file    Gets together: Not on file    Attends religious service: Not on file    Active member of club or organization: Not on file    Attends meetings of clubs or organizations: Not on file    Relationship status: Not on file  . Intimate partner violence    Fear of  current or ex partner: Not on file    Emotionally abused: Not on file    Physically abused: Not on file    Forced sexual activity: Not on file  Other Topics Concern  . Not on file  Social History Narrative  . Not on file   Past Surgical History:  Procedure Laterality Date  . ANTERIOR CRUCIATE LIGAMENT REPAIR Left 2001  . BIOPSY  01/26/2018   Procedure: BIOPSY;  Surgeon: Danie Binder, MD;  [facility];  Service: Endoscopy;;  gastric and duodenuem  . COLONOSCOPY N/A 01/26/2018   two 3-4 mm polyps in sigmoid colon (hyperplastic), internal hemorrhoids. Next age 21-50  . ENDOMETRIAL ABLATION  02/2017   [place], [place]  . ESOPHAGOGASTRODUODENOSCOPY N/A 01/26/2018   EGD with few gastric polyps, fundic gland. Gastritis, normal small bowel biopsies  . POLYPECTOMY  01/26/2018   Procedure: POLYPECTOMY;  Surgeon: Danie Binder, MD;  [facility];  Service: Endoscopy;;  sigmoid   Family History  Problem Relation Age of Onset  . Cancer Father        lung  . Schizophrenia Brother   . Diabetes Brother   . Hypertension Brother   . Cancer Maternal Grandmother   . Colon cancer  Neg Hx   . Colon polyps Neg Hx    No current outpatient medications on file.  No Known Allergies   ROS: Review of Systems Constitutional: negative Eyes: positive for contacts/glasses Ears, nose, mouth, throat, and face: negative Respiratory: negative Cardiovascular: negative Gastrointestinal: negative Genitourinary:negative Integument/breast: negative Hematologic/lymphatic: negative Musculoskeletal:positive for back pain and LLE sciatica Neurological: negative Behavioral/Psych: negative Endocrine: negative Allergic/Immunologic: negative    Physical exam BP 117/78   Pulse 89   Temp 97.8 F (36.6 C) (Temporal)   Ht '5\' 6"'  (1.676 m)   Wt 220 lb (99.8 kg)   LMP 11/02/2018 (Exact Date)   SpO2 98%   BMI 35.51 kg/m  General appearance: alert, cooperative, appears stated age, no  distress and moderately obese Head: Normocephalic, without obvious abnormality, atraumatic Eyes: negative findings: lids and lashes normal, conjunctivae and sclerae normal, corneas clear, pupils equal, round, reactive to light and accomodation and visual fields full to confrontation Ears: normal TM's and external ear canals both ears Nose: Nares normal. Septum midline. Mucosa normal. No drainage or sinus tenderness. Throat: lips, mucosa, and tongue normal; teeth and gums normal Neck: no adenopathy, no carotid bruit, no JVD, supple, symmetrical, trachea midline and thyroid not enlarged, symmetric, no tenderness/mass/nodules Back: symmetric, no curvature. ROM normal. No CVA tenderness. Lungs: clear to auscultation bilaterally Breasts: not examined Heart: regular rate and rhythm, S1, S2 normal, no murmur, click, rub or gallop Abdomen: soft, non-tender; bowel sounds normal; no masses,  no organomegaly Pelvic: cervix normal in appearance, external genitalia normal, no adnexal masses or tenderness, no cervical motion tenderness, rectovaginal septum normal, uterus normal size, shape, and consistency and Moderate white thick discharge from the cervical os within the vaginal vault Extremities: Warm, well perfused, +2 pedal pulses.  No pitting edema noted. Pulses: 2+ and symmetric Skin: Skin color, texture, turgor normal. No rashes or lesions Lymph nodes: Cervical, supraclavicular, and axillary nodes normal. Neurologic: Alert and oriented X 3, normal strength and tone. Normal symmetric reflexes. Normal coordination and gait Psych: Mood stable, speech normal, affect appropriate, pleasant and interactive   Assessment/ Plan: Janice Galvan here for annual physical exam.   1. Well woman exam with routine gynecological exam Counseled today on low-salt diet.  No significant pedal edema noted on exam today but patient is feeling that the legs are more swollen than her normal.  Continue compression hose.   2. Screening for cervical cancer Never sexually active. - Pap IG, CT/NG NAA, and HPV (high risk) [facility]  3. Microcytic anemia Possibly causing the edema she is noting - CBC  4. Morbid obesity ([place]) - Bayer DCA Hb A1c Waived - CMP14+EGFR - Lipid Panel - TSH  5. Influenza vaccination declined by patient Counseled  6. Bacterial vaginosis Wet prep with clue cells.  Start Flagyl twice daily.  Avoid alcohol. - WET PREP FOR TRICH, YEAST, CLUE - metroNIDAZOLE (FLAGYL) 500 MG tablet; Take 1 tablet (500 mg total) by mouth 2 (two) times daily for 7 days.  Dispense: 14 tablet; Refill: 0   Patient to follow up in 1 year for annual exam or sooner if needed.  Juanito Gonyer M. Lajuana Ripple, DO

## 2018-11-24 ENCOUNTER — Ambulatory Visit: Payer: BLUE CROSS/BLUE SHIELD | Admitting: Gastroenterology

## 2018-11-24 LAB — CBC
Hematocrit: 36.4 % (ref 34.0–46.6)
Hemoglobin: 12.3 g/dL (ref 11.1–15.9)
MCH: 29.6 pg (ref 26.6–33.0)
MCHC: 33.8 g/dL (ref 31.5–35.7)
MCV: 88 fL (ref 79–97)
Platelets: 383 10*3/uL (ref 150–450)
RBC: 4.16 x10E6/uL (ref 3.77–5.28)
RDW: 11.6 % — ABNORMAL LOW (ref 11.7–15.4)
WBC: 5.2 10*3/uL (ref 3.4–10.8)

## 2018-11-24 LAB — CMP14+EGFR
ALT: 16 IU/L (ref 0–32)
AST: 17 IU/L (ref 0–40)
Albumin/Globulin Ratio: 1.4 (ref 1.2–2.2)
Albumin: 4.1 g/dL (ref 3.8–4.8)
Alkaline Phosphatase: 76 IU/L (ref 39–117)
BUN/Creatinine Ratio: 16 (ref 9–23)
BUN: 13 mg/dL (ref 6–24)
Bilirubin Total: 0.6 mg/dL (ref 0.0–1.2)
CO2: 22 mmol/L (ref 20–29)
Calcium: 9.3 mg/dL (ref 8.7–10.2)
Chloride: 101 mmol/L (ref 96–106)
Creatinine, Ser: 0.83 mg/dL (ref 0.57–1.00)
GFR calc Af Amer: 101 mL/min/{1.73_m2} (ref >59)
GFR calc non Af Amer: 88 mL/min/{1.73_m2} (ref >59)
Globulin, Total: 3 g/dL (ref 1.5–4.5)
Glucose: 90 mg/dL (ref 65–99)
Potassium: 4.3 mmol/L (ref 3.5–5.2)
Sodium: 135 mmol/L (ref 134–144)
Total Protein: 7.1 g/dL (ref 6.0–8.5)

## 2018-11-24 LAB — LIPID PANEL
Chol/HDL Ratio: 3.1 ratio (ref 0.0–4.4)
Cholesterol, Total: 135 mg/dL (ref 100–199)
HDL: 44 mg/dL (ref >39)
LDL Chol Calc (NIH): 75 mg/dL (ref 0–99)
Triglycerides: 83 mg/dL (ref 0–149)
VLDL Cholesterol Cal: 16 mg/dL (ref 5–40)

## 2018-11-24 LAB — TSH: TSH: 4.17 u[IU]/mL (ref 0.450–4.500)

## 2018-12-01 LAB — PAP IG, CT-NG NAA, HPV HIGH-RISK
Chlamydia, Nuc. Acid Amp: NEGATIVE
Gonococcus by Nucleic Acid Amp: NEGATIVE
HPV, high-risk: NEGATIVE

## 2018-12-02 ENCOUNTER — Other Ambulatory Visit: Payer: Self-pay | Admitting: Family Medicine

## 2018-12-02 DIAGNOSIS — B3731 Acute candidiasis of vulva and vagina: Secondary | ICD-10-CM

## 2018-12-02 DIAGNOSIS — B373 Candidiasis of vulva and vagina: Secondary | ICD-10-CM

## 2018-12-02 MED ORDER — FLUCONAZOLE 150 MG PO TABS: 150.0000 mg | ORAL_TABLET | Freq: Once | ORAL | 0 refills | Status: AC

## 2018-12-02 NOTE — Progress Notes (Signed)
y

## 2018-12-17 DIAGNOSIS — L280 Lichen simplex chronicus: Secondary | ICD-10-CM | POA: Diagnosis not present

## 2018-12-17 DIAGNOSIS — L299 Pruritus, unspecified: Secondary | ICD-10-CM | POA: Diagnosis not present

## 2019-05-13 DIAGNOSIS — Z23 Encounter for immunization: Secondary | ICD-10-CM | POA: Diagnosis not present

## 2019-06-10 DIAGNOSIS — Z23 Encounter for immunization: Secondary | ICD-10-CM | POA: Diagnosis not present

## 2019-10-11 ENCOUNTER — Other Ambulatory Visit (HOSPITAL_COMMUNITY): Payer: Self-pay | Admitting: Family Medicine

## 2019-10-11 DIAGNOSIS — Z1231 Encounter for screening mammogram for malignant neoplasm of breast: Secondary | ICD-10-CM

## 2019-10-18 ENCOUNTER — Inpatient Hospital Stay (HOSPITAL_COMMUNITY): Admission: RE | Admit: 2019-10-18 | Payer: BC Managed Care – PPO | Source: Ambulatory Visit

## 2019-11-15 ENCOUNTER — Encounter (HOSPITAL_COMMUNITY): Payer: BC Managed Care – PPO

## 2019-11-15 ENCOUNTER — Ambulatory Visit (HOSPITAL_COMMUNITY)
Admission: RE | Admit: 2019-11-15 | Discharge: 2019-11-15 | Disposition: A | Discharge status: Home / Self Care | Payer: BC Managed Care – PPO | Source: Ambulatory Visit | Attending: Family Medicine | Admitting: Family Medicine

## 2019-11-15 ENCOUNTER — Other Ambulatory Visit: Payer: Self-pay

## 2019-11-15 DIAGNOSIS — Z1231 Encounter for screening mammogram for malignant neoplasm of breast: Secondary | ICD-10-CM | POA: Diagnosis not present

## 2019-11-29 ENCOUNTER — Encounter: Payer: Self-pay | Admitting: Family Medicine

## 2019-11-29 ENCOUNTER — Other Ambulatory Visit: Payer: Self-pay

## 2019-11-29 ENCOUNTER — Ambulatory Visit (INDEPENDENT_AMBULATORY_CARE_PROVIDER_SITE_OTHER): Payer: BC Managed Care – PPO | Admitting: Family Medicine

## 2019-11-29 VITALS — BP 112/71 | HR 89 | Temp 96.8°F | Ht 66.0 in | Wt 217.0 lb

## 2019-11-29 DIAGNOSIS — Z0000 Encounter for general adult medical examination without abnormal findings: Secondary | ICD-10-CM

## 2019-11-29 DIAGNOSIS — E669 Obesity, unspecified: Secondary | ICD-10-CM

## 2019-11-29 DIAGNOSIS — Z0001 Encounter for general adult medical examination with abnormal findings: Secondary | ICD-10-CM | POA: Diagnosis not present

## 2019-11-29 DIAGNOSIS — Z130 Encounter for screening for diseases of the blood and blood-forming organs and certain disorders involving the immune mechanism: Secondary | ICD-10-CM

## 2019-11-29 NOTE — Progress Notes (Signed)
Janice Galvan is a 44 y.o. female presents to office today for annual physical exam examination.    Concerns today include: 1.  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, Marital status: Not married, Substance use: None Diet: Fair, could use more fiber, Exercise: No structured Last eye exam: Up-to-date Last dental exam: Up-to-date Last mammogram: Up-to-date Last pap smear: Up-to-date Refills needed today: N/A Immunizations needed: Flu Vaccine declined Past Medical History:  Diagnosis Date   Anemia    HSV-1 (herpes simplex virus 1) infection    Social History   Socioeconomic History   Marital status: Single    Spouse name: Not on file   Number of children: Not on file   Years of education: Not on file   Highest education level: Not on file  Occupational History   Not on file  Tobacco Use   Smoking status: Never Smoker   Smokeless tobacco: Never Used  Vaping Use   Vaping Use: Never used  Substance and Sexual Activity   Alcohol use: Yes    Comment: occ   Drug use: No   Sexual activity: Not Currently    Birth control/protection: None  Other Topics Concern   Not on file  Social History Narrative   Not on file   Social Determinants of Health   Financial Resource Strain:    Difficulty of Paying Living Expenses: Not on file  Food Insecurity:    Worried About Running Out of Food in the Last Year: Not on file   Ran Out of Food in the Last Year: Not on file  Transportation Needs:    Lack of Transportation (Medical): Not on file   Lack of Transportation (Non-Medical): Not on file  Physical Activity:    Days of Exercise per Week: Not on file   Minutes of Exercise per Session: Not on file  Stress:    Feeling of Stress : Not on file  Social Connections:    Frequency of Communication with Friends and Family: Not on file   Frequency of Social Gatherings with Friends and Family: Not on file   Attends Religious Services:  Not on file   Active Member of Clubs or Organizations: Not on file   Attends Archivist Meetings: Not on file   Marital Status: Not on file  Intimate Partner Violence:    Fear of Current or Ex-Partner: Not on file   Emotionally Abused: Not on file   Physically Abused: Not on file   Sexually Abused: Not on file   Past Surgical History:  Procedure Laterality Date   ANTERIOR CRUCIATE LIGAMENT REPAIR Left 2001   BIOPSY  01/26/2018   Procedure: BIOPSY;  Surgeon: Janice Binder, MD;  [facility];  Service: Endoscopy;;  gastric and duodenuem   COLONOSCOPY N/A 01/26/2018   two 3-4 mm polyps in sigmoid colon (hyperplastic), internal hemorrhoids. Next age 78-50   ENDOMETRIAL ABLATION  02/2017   [place], [place]   ESOPHAGOGASTRODUODENOSCOPY N/A 01/26/2018   EGD with few gastric polyps, fundic gland. Gastritis, normal small bowel biopsies   POLYPECTOMY  01/26/2018   Procedure: POLYPECTOMY;  Surgeon: Janice Binder, MD;  [facility]: [facility];  Service: Endoscopy;;  sigmoid   Family History  Problem Relation Age of Onset   Cancer Father        lung   Schizophrenia Brother    Diabetes Brother    Hypertension Brother    Cancer Maternal Grandmother    Colon cancer  Neg Hx    Colon polyps Neg Hx    No current outpatient medications on file.  No Known Allergies   ROS: Review of Systems A comprehensive review of systems was negative.    Physical exam BP 112/71    Pulse 89    Temp (!) 96.8 F (36 C) (Temporal)    Ht '5\' 6"'  (1.676 m)    Wt 217 lb (98.4 kg)    LMP 10/31/2019 (Exact Date)    SpO2 97%    BMI 35.02 kg/m  General appearance: alert, cooperative, appears stated age, no distress and mildly obese Head: Normocephalic, without obvious abnormality, atraumatic Eyes: negative findings: lids and lashes normal, conjunctivae and sclerae normal, corneas clear and pupils equal, round, reactive to light and accomodation Ears: normal TM's and external  ear canals both ears Nose: Nares normal. Septum midline. Mucosa normal. No drainage or sinus tenderness. Throat: lips, mucosa, and tongue normal; teeth and gums normal Neck: no adenopathy, no carotid bruit, supple, symmetrical, trachea midline and thyroid not enlarged, symmetric, no tenderness/mass/nodules Back: symmetric, no curvature. ROM normal. No CVA tenderness. Lungs: clear to auscultation bilaterally Heart: regular rate and rhythm, S1, S2 normal, no murmur, click, rub or gallop Abdomen: soft, non-tender; bowel sounds normal; no masses,  no organomegaly Extremities: extremities normal, atraumatic, no cyanosis or edema Pulses: 2+ and symmetric Skin: Skin color, texture, turgor normal. No rashes or lesions Lymph nodes: Cervical, supraclavicular, and axillary nodes normal. Neurologic: Alert and oriented X 3, normal strength and tone. Normal symmetric reflexes. Normal coordination and gait Psych: Mood stable, speech normal, affect appropriate.  Patient is pleasant and interactive  Depression screen Janice Galvan 2/9 11/29/2019 11/23/2018 10/27/2017  Decreased Interest 0 0 0  Down, Depressed, Hopeless 0 0 0  PHQ - 2 Score 0 0 0  Altered sleeping 2 0 -  Tired, decreased energy 0 0 -  Change in appetite 0 0 -  Feeling bad or failure about yourself  2 0 -  Trouble concentrating 0 0 -  Moving slowly or fidgety/restless 0 0 -  Suicidal thoughts 0 0 -  PHQ-9 Score 4 0 -      Assessment/ Plan: Janice Galvan here for annual physical exam.   1. Annual physical exam Up-to-date on preventive health.  Could consider screening for hepatitis C and HIV if not has already done previously.  At this point no apparent risk factors for either of these illnesses  2. Obesity (BMI 35.0-39.9 without comorbidity) Will come in for fasting labs - Lipid panel; Future - CMP14+EGFR; Future - TSH; Future - Bayer DCA Hb A1c Waived; Future  3. Screening, anemia, deficiency, iron - CBC; Future   Counseled on  healthy lifestyle choices, including diet (rich in fruits, vegetables and lean meats and low in salt and simple carbohydrates) and exercise (at least 30 minutes of moderate physical activity daily).  Patient to follow up in 1 year for annual exam or sooner if needed.  Janice Delia M. Lajuana Ripple, DO

## 2019-11-29 NOTE — Patient Instructions (Signed)
Work on incorporating more fiber into [employer].  Focus on lean meats, low carbohydrates.  Come in for your fasting labs at your convenience.  You do not need an appointment for these.  They have been already ordered.  You should expect a call back within about 48 hours of lab draw.  Preventive Care 53-42 Years Old, Female Preventive care refers to visits with your health care provider and lifestyle choices that can promote health and wellness. This includes:  A yearly physical exam. This may also be called an annual well check.  Regular dental visits and eye exams.  Immunizations.  Screening for certain conditions.  Healthy lifestyle choices, such as eating a healthy diet, getting regular exercise, not using drugs or products that contain nicotine and tobacco, and limiting alcohol use. What can I expect for my preventive care visit? Physical exam Your health care provider will check your:  Height and weight. This may be used to calculate body mass index (BMI), which tells if you are at a healthy weight.  Heart rate and blood pressure.  Skin for abnormal spots. Counseling Your health care provider may ask you questions about your:  Alcohol, tobacco, and drug use.  Emotional well-being.  Home and relationship well-being.  Sexual activity.  Eating habits.  Work and work Statistician.  Method of birth control.  Menstrual cycle.  Pregnancy history. What immunizations do I need?  Influenza (flu) vaccine  This is recommended every year. Tetanus, diphtheria, and pertussis (Tdap) vaccine  You may need a Td booster every 10 years. Varicella (chickenpox) vaccine  You may need this if you have not been vaccinated. Zoster (shingles) vaccine  You may need this after age 42. Measles, mumps, and rubella (MMR) vaccine  You may need at least one dose of MMR if you were born in 1957 or later. You may also need a second dose. Pneumococcal conjugate (PCV13) vaccine  You may  need this if you have certain conditions and were not previously vaccinated. Pneumococcal polysaccharide (PPSV23) vaccine  You may need one or two doses if you smoke cigarettes or if you have certain conditions. Meningococcal conjugate (MenACWY) vaccine  You may need this if you have certain conditions. Hepatitis A vaccine  You may need this if you have certain conditions or if you travel or work in places where you may be exposed to hepatitis A. Hepatitis B vaccine  You may need this if you have certain conditions or if you travel or work in places where you may be exposed to hepatitis B. Haemophilus influenzae type b (Hib) vaccine  You may need this if you have certain conditions. Human papillomavirus (HPV) vaccine  If recommended by your health care provider, you may need three doses over 6 months. You may receive vaccines as individual doses or as more than one vaccine together in one shot (combination vaccines). Talk with your health care provider about the risks and benefits of combination vaccines. What tests do I need? Blood tests  Lipid and cholesterol levels. These may be checked every 5 years, or more frequently if you are over 54 years old.  Hepatitis C test.  Hepatitis B test. Screening  Lung cancer screening. You may have this screening every year starting at age 23 if you have a 30-pack-year history of smoking and currently smoke or have quit within the past 15 years.  Colorectal cancer screening. All adults should have this screening starting at age 63 and continuing until age 74. Your health care  provider may recommend screening at age 13 if you are at increased risk. You will have tests every 1-10 years, depending on your results and the type of screening test.  Diabetes screening. This is done by checking your blood sugar (glucose) after you have not eaten for a while (fasting). You may have this done every 1-3 years.  Mammogram. This may be done every 1-2  years. Talk with your health care provider about when you should start having regular mammograms. This may depend on whether you have a family history of breast cancer.  BRCA-related cancer screening. This may be done if you have a family history of breast, ovarian, tubal, or peritoneal cancers.  Pelvic exam and Pap test. This may be done every 3 years starting at age 80. Starting at age 27, this may be done every 5 years if you have a Pap test in combination with an HPV test. Other tests  Sexually transmitted disease (STD) testing.  Bone density scan. This is done to screen for osteoporosis. You may have this scan if you are at high risk for osteoporosis. Follow these instructions at home: Eating and drinking  Eat a diet that includes fresh fruits and vegetables, whole grains, lean protein, and low-fat dairy.  Take vitamin and mineral supplements as recommended by your health care provider.  Do not drink alcohol if: ? Your health care provider tells you not to drink. ? You are pregnant, may be pregnant, or are planning to become pregnant.  If you drink alcohol: ? Limit how much you have to 0-1 drink a day. ? Be aware of how much alcohol is in your drink. In the [place] one drink equals one 12 oz bottle of beer (355 mL), one 5 oz glass of wine (148 mL), or one 1 oz glass of hard liquor (44 mL). Lifestyle  Take daily care of your teeth and gums.  Stay active. Exercise for at least 30 minutes on 5 or more days each week.  Do not use any products that contain nicotine or tobacco, such as cigarettes, e-cigarettes, and chewing tobacco. If you need help quitting, ask your health care provider.  If you are sexually active, practice safe sex. Use a condom or other form of birth control (contraception) in order to prevent pregnancy and STIs (sexually transmitted infections).  If told by your health care provider, take low-dose aspirin daily starting at age 23. What's next?  Visit your  health care provider once a year for a well check visit.  Ask your health care provider how often you should have your eyes and teeth checked.  Stay up to date on all vaccines. This information is not intended to replace advice given to you by your health care provider. Make sure you discuss any questions you have with your health care provider. Document Revised: 09/25/2017 Document Reviewed: 09/25/2017 Elsevier Patient Education  2020 [employer].

## 2020-02-07 DIAGNOSIS — Z23 Encounter for immunization: Secondary | ICD-10-CM | POA: Diagnosis not present

## 2020-07-03 DIAGNOSIS — Z20822 Contact with and (suspected) exposure to covid-19: Secondary | ICD-10-CM | POA: Diagnosis not present

## 2020-10-02 ENCOUNTER — Other Ambulatory Visit (HOSPITAL_COMMUNITY): Payer: Self-pay | Admitting: Family Medicine

## 2020-10-02 DIAGNOSIS — Z1231 Encounter for screening mammogram for malignant neoplasm of breast: Secondary | ICD-10-CM

## 2020-11-20 ENCOUNTER — Ambulatory Visit (HOSPITAL_COMMUNITY)
Admission: RE | Admit: 2020-11-20 | Discharge: 2020-11-20 | Disposition: A | Discharge status: Home / Self Care | Payer: BC Managed Care – PPO | Source: Ambulatory Visit | Attending: Family Medicine | Admitting: Family Medicine

## 2020-11-20 ENCOUNTER — Other Ambulatory Visit: Payer: Self-pay

## 2020-11-20 DIAGNOSIS — Z1231 Encounter for screening mammogram for malignant neoplasm of breast: Secondary | ICD-10-CM | POA: Diagnosis not present

## 2020-11-20 DIAGNOSIS — Z20822 Contact with and (suspected) exposure to covid-19: Secondary | ICD-10-CM | POA: Diagnosis not present

## 2021-01-04 ENCOUNTER — Encounter: Payer: Self-pay | Admitting: Family Medicine

## 2021-01-04 ENCOUNTER — Ambulatory Visit (INDEPENDENT_AMBULATORY_CARE_PROVIDER_SITE_OTHER): Payer: BC Managed Care – PPO | Admitting: Family Medicine

## 2021-01-04 VITALS — BP 117/68 | HR 81 | Temp 98.8°F | Ht 66.0 in | Wt 222.4 lb

## 2021-01-04 DIAGNOSIS — Z0001 Encounter for general adult medical examination with abnormal findings: Secondary | ICD-10-CM | POA: Diagnosis not present

## 2021-01-04 DIAGNOSIS — D500 Iron deficiency anemia secondary to blood loss (chronic): Secondary | ICD-10-CM

## 2021-01-04 DIAGNOSIS — R61 Generalized hyperhidrosis: Secondary | ICD-10-CM

## 2021-01-04 DIAGNOSIS — R232 Flushing: Secondary | ICD-10-CM | POA: Diagnosis not present

## 2021-01-04 DIAGNOSIS — E669 Obesity, unspecified: Secondary | ICD-10-CM | POA: Diagnosis not present

## 2021-01-04 DIAGNOSIS — Z0000 Encounter for general adult medical examination without abnormal findings: Secondary | ICD-10-CM

## 2021-01-04 LAB — BAYER DCA HB A1C WAIVED: HB A1C (BAYER DCA - WAIVED): 4.6 % — ABNORMAL LOW (ref 4.8–5.6)

## 2021-01-04 NOTE — Progress Notes (Signed)
Janice Galvan is a 45 y.o. female presents to office today for annual physical exam examination.    Concerns today include: 1. Hot flashes She reports that she gets night sweats fairly regularly.  This has been ongoing for several months.  She tries to keep her bedroom at in ambient temperature.  She has a memory foam mattress.  Denies any unplanned weight loss, change in appetite.  No change in bowel habits.  No heart palpitations.  No tremors.  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, art, Substance use: None Diet: For the most part balanced, Exercise: No structured Last eye exam: Up-to-date Last dental exam: Up-to-date Last mammogram: Up-to-date Last pap smear: Up-to-date Refills needed today: N/A Immunizations needed: Immunization History  Administered Date(s) Administered   Tdap 10/27/2017    Past Medical History:  Diagnosis Date   Anemia    HSV-1 (herpes simplex virus 1) infection    Social History   Socioeconomic History   Marital status: Single    Spouse name: Not on file   Number of children: Not on file   Years of education: Not on file   Highest education level: Not on file  Occupational History   Not on file  Tobacco Use   Smoking status: Never   Smokeless tobacco: Never  Vaping Use   Vaping Use: Never used  Substance and Sexual Activity   Alcohol use: Yes    Comment: occ   Drug use: No   Sexual activity: Not Currently    Birth control/protection: None  Other Topics Concern   Not on file  Social History Narrative   Not on file   Social Determinants of Health   Financial Resource Strain: Not on file  Food Insecurity: Not on file  Transportation Needs: Not on file  Physical Activity: Not on file  Stress: Not on file  Social Connections: Not on file  Intimate Partner Violence: Not on file   Past Surgical History:  Procedure Laterality Date   ANTERIOR CRUCIATE LIGAMENT REPAIR Left 2001   BIOPSY  01/26/2018   Procedure: BIOPSY;   Surgeon: Danie Binder, MD;  [facility];  Service: Endoscopy;;  gastric and duodenuem   COLONOSCOPY N/A 01/26/2018   two 3-4 mm polyps in sigmoid colon (hyperplastic), internal hemorrhoids. Next age 63-50   ENDOMETRIAL ABLATION  02/2017   [place], [place]   ESOPHAGOGASTRODUODENOSCOPY N/A 01/26/2018   EGD with few gastric polyps, fundic gland. Gastritis, normal small bowel biopsies   POLYPECTOMY  01/26/2018   Procedure: POLYPECTOMY;  Surgeon: Danie Binder, MD;  [facility];  Service: Endoscopy;;  sigmoid   Family History  Problem Relation Age of Onset   Cancer Father        lung   Schizophrenia Brother    Diabetes Brother    Hypertension Brother    Cancer Maternal Grandmother    Colon cancer Neg Hx    Colon polyps Neg Hx    No current outpatient medications on file.  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 Septum midline. Mucosa normal. No drainage or sinus tenderness. Throat: lips, mucosa, and tongue normal; teeth and gums normal Neck: no adenopathy, no carotid bruit, supple, symmetrical, trachea midline, and thyroid not enlarged, symmetric, no tenderness/mass/nodules Back: symmetric, no curvature. ROM normal. No CVA tenderness. Lungs: clear to  auscultation bilaterally Heart: regular rate and rhythm, S1, S2 normal, no murmur, click, rub or gallop Abdomen: soft, non-tender; bowel sounds normal; no masses,  no organomegaly Extremities: extremities normal, atraumatic, no cyanosis or edema Pulses: 2+ and symmetric Skin: Skin color, texture, turgor normal. No rashes or lesions Lymph nodes: Cervical, supraclavicular, and axillary nodes normal. Neurologic: Grossly normal Psych: Mood stable, speech normal  Depression screen [facility] 2/9 01/04/2021 11/29/2019 11/23/2018  Decreased Interest 0 0 0  Down, Depressed, Hopeless 1 0 0  PHQ - 2 Score 1 0 0  Altered sleeping 0 2 0  Tired, decreased energy 0 0 0  Change in appetite 0 0 0  Feeling bad or failure about yourself  1 2 0  Trouble concentrating 0 0 0  Moving slowly or fidgety/restless 0 0 0  Suicidal thoughts 0 0 0  PHQ-9 Score 2 4 0  Difficult doing work/chores Not difficult at all - -   Assessment/ Plan: Janice Galvan here for annual physical exam.   Annual physical exam  Obesity (BMI 35.0-39.9 without comorbidity) - Plan: Bayer DCA Hb A1c Waived, Lipid panel, TSH, T4, free, CMP14+EGFR, CMP14+EGFR, T4, free, TSH, Lipid panel, Bayer DCA Hb A1c Waived  Iron deficiency anemia due to chronic blood loss - Plan: CBC, Iron, Iron, CBC  Hot flashes - Plan: TSH, T4, free, CMP14+EGFR, CBC, CBC, CMP14+EGFR, T4, free, TSH  Night sweat - Plan: TSH, T4, free, CMP14+EGFR, CBC, CBC, CMP14+EGFR, T4, free, TSH  Up-to-date on mammogram.  Fasting labs obtained today.  No evidence of diabetes  CBC and iron levels obtained due to history of iron deficiency anemia  I wonder of the hot flashes that she is experiencing are really manifestations of needing to get cooling bedding.  She certainly does not have any other symptoms that would be suggestive of early menopause as she continues to have a normal menstrual cycle.  Additionally, she has no symptoms during the daytime.  Her physical exam was  unremarkable today except for BMI above goal.  There was certainly no evidence of thyromegaly, thyroid masses.  No other concerning B symptoms that would be suggestive of malignancy.  We will look for metabolic etiology with thyroid lab, CMP, CBC.  I would like her to follow-up with me if her symptoms do not improve with change in bedding.  Would consider further evaluation with chest x-ray, etc.  Counseled on healthy lifestyle choices, including diet (rich in fruits, vegetables and lean meats and low in salt and simple carbohydrates) and exercise (at least 30 minutes of moderate physical activity daily).  Patient to follow up in 1 year for annual exam or sooner if needed.  Praneel Haisley M. Lajuana Ripple, DO

## 2021-01-04 NOTE — Patient Instructions (Signed)
You had labs performed today.  You will be contacted with the results of the labs once they are available, usually in the next 3 business days for routine lab work.  If you have an active my chart account, they will be released to your MyChart.  If you prefer to have these labs released to you via telephone, please let us know.  We talked about cool bedding. Let me know if that is helpful.  We'll check for any metabolic derangements in the meantime.  Preventive Care 9-49 Years Old, Female Preventive care refers to lifestyle choices and visits with your health care provider that can promote health and wellness. Preventive care visits are also called wellness exams. What can I expect for my preventive care visit? Counseling Your health care provider may ask you questions about your: Medical history, including: Past medical problems. Family medical history. Pregnancy history. Current health, including: Menstrual cycle. Method of birth control. Emotional well-being. Home life and relationship well-being. Sexual activity and sexual health. Lifestyle, including: Alcohol, nicotine or tobacco, and drug use. Access to firearms. Diet, exercise, and sleep habits. Work and work Statistician. Sunscreen use. Safety issues such as seatbelt and bike helmet use. Physical exam Your health care provider will check your: Height and weight. These may be used to calculate your BMI (body mass index). BMI is a measurement that tells if you are at a healthy weight. Waist circumference. This measures the distance around your waistline. This measurement also tells if you are at a healthy weight and may help predict your risk of certain diseases, such as type 2 diabetes and high blood pressure. Heart rate and blood pressure. Body temperature. Skin for abnormal spots. What immunizations do I need? Vaccines are usually given at various ages, according to a schedule. Your health care provider will recommend  vaccines for you based on your age, medical history, and lifestyle or other factors, such as travel or where you work. What tests do I need? Screening Your health care provider may recommend screening tests for certain conditions. This may include: Lipid and cholesterol levels. Diabetes screening. This is done by checking your blood sugar (glucose) after you have not eaten for a while (fasting). Pelvic exam and Pap test. Hepatitis B test. Hepatitis C test. HIV (human immunodeficiency virus) test. STI (sexually transmitted infection) testing, if you are at risk. Lung cancer screening. Colorectal cancer screening. Mammogram. Talk with your health care provider about when you should start having regular mammograms. This may depend on whether you have a family history of breast cancer. BRCA-related cancer screening. This may be done if you have a family history of breast, ovarian, tubal, or peritoneal cancers. Bone density scan. This is done to screen for osteoporosis. Talk with your health care provider about your test results, treatment options, and if necessary, the need for more tests. Follow these instructions at home: Eating and drinking  Eat a diet that includes fresh fruits and vegetables, whole grains, lean protein, and low-fat dairy products. Take vitamin and mineral supplements as recommended by your health care provider. Do not drink alcohol if: Your health care provider tells you not to drink. You are pregnant, may be pregnant, or are planning to become pregnant. If you drink alcohol: Limit how much you have to 0-1 drink a day. Know how much alcohol is in your drink. In the [place] one drink equals one 12 oz bottle of beer (355 mL), one 5 oz glass of wine (148 mL), or one 1 oz  glass of hard liquor (44 mL). Lifestyle Brush your teeth every morning and night with fluoride toothpaste. Floss one time each day. Exercise for at least 30 minutes 5 or more days each week. Do not use  any products that contain nicotine or tobacco. These products include cigarettes, chewing tobacco, and vaping devices, such as e-cigarettes. If you need help quitting, ask your health care provider. Do not use drugs. If you are sexually active, practice safe sex. Use a condom or other form of protection to prevent STIs. If you do not wish to become pregnant, use a form of birth control. If you plan to become pregnant, see your health care provider for a prepregnancy visit. Take aspirin only as told by your health care provider. Make sure that you understand how much to take and what form to take. Work with your health care provider to find out whether it is safe and beneficial for you to take aspirin daily. Find healthy ways to manage stress, such as: Meditation, yoga, or listening to music. Journaling. Talking to a trusted person. Spending time with friends and family. Minimize exposure to UV radiation to reduce your risk of skin cancer. Safety Always wear your seat belt while driving or riding in a vehicle. Do not drive: If you have been drinking alcohol. Do not ride with someone who has been drinking. When you are tired or distracted. While texting. If you have been using any mind-altering substances or drugs. Wear a helmet and other protective equipment during sports activities. If you have firearms in your house, make sure you follow all gun safety procedures. Seek help if you have been physically or sexually abused. What's next? Visit your health care provider once a year for an annual wellness visit. Ask your health care provider how often you should have your eyes and teeth checked. Stay up to date on all vaccines. This information is not intended to replace advice given to you by your health care provider. Make sure you discuss any questions you have with your health care provider. Document Revised: 07/12/2020 Document Reviewed: 07/12/2020 Elsevier Patient Education  [place].

## 2021-01-05 LAB — CMP14+EGFR
ALT: 21 IU/L (ref 0–32)
AST: 17 IU/L (ref 0–40)
Albumin/Globulin Ratio: 1.6 (ref 1.2–2.2)
Albumin: 4.7 g/dL (ref 3.8–4.8)
Alkaline Phosphatase: 78 IU/L (ref 44–121)
BUN/Creatinine Ratio: 14 (ref 9–23)
BUN: 13 mg/dL (ref 6–24)
Bilirubin Total: 0.8 mg/dL (ref 0.0–1.2)
CO2: 24 mmol/L (ref 20–29)
Calcium: 9.8 mg/dL (ref 8.7–10.2)
Chloride: 101 mmol/L (ref 96–106)
Creatinine, Ser: 0.93 mg/dL (ref 0.57–1.00)
Globulin, Total: 2.9 g/dL (ref 1.5–4.5)
Glucose: 75 mg/dL (ref 70–99)
Potassium: 4.5 mmol/L (ref 3.5–5.2)
Sodium: 139 mmol/L (ref 134–144)
Total Protein: 7.6 g/dL (ref 6.0–8.5)
eGFR: 78 mL/min/{1.73_m2} (ref >59)

## 2021-01-05 LAB — TSH: TSH: 2.84 u[IU]/mL (ref 0.450–4.500)

## 2021-01-05 LAB — LIPID PANEL
Chol/HDL Ratio: 2.9 ratio (ref 0.0–4.4)
Cholesterol, Total: 174 mg/dL (ref 100–199)
HDL: 61 mg/dL (ref >39)
LDL Chol Calc (NIH): 101 mg/dL — ABNORMAL HIGH (ref 0–99)
Triglycerides: 65 mg/dL (ref 0–149)
VLDL Cholesterol Cal: 12 mg/dL (ref 5–40)

## 2021-01-05 LAB — CBC
Hematocrit: 38 % (ref 34.0–46.6)
Hemoglobin: 12.7 g/dL (ref 11.1–15.9)
MCH: 29.7 pg (ref 26.6–33.0)
MCHC: 33.4 g/dL (ref 31.5–35.7)
MCV: 89 fL (ref 79–97)
Platelets: 419 10*3/uL (ref 150–450)
RBC: 4.28 x10E6/uL (ref 3.77–5.28)
RDW: 11.7 % (ref 11.7–15.4)
WBC: 5.4 10*3/uL (ref 3.4–10.8)

## 2021-01-05 LAB — IRON: Iron: 64 ug/dL (ref 27–159)

## 2021-01-05 LAB — T4, FREE: Free T4: 1.25 ng/dL (ref 0.82–1.77)

## 2021-01-08 ENCOUNTER — Encounter: Payer: BC Managed Care – PPO | Admitting: Family Medicine

## 2021-02-12 ENCOUNTER — Encounter: Payer: BC Managed Care – PPO | Admitting: Family Medicine

## 2021-09-24 ENCOUNTER — Telehealth: Payer: Self-pay | Admitting: Family Medicine

## 2021-09-25 ENCOUNTER — Other Ambulatory Visit: Payer: Self-pay | Admitting: Family Medicine

## 2021-09-25 DIAGNOSIS — D500 Iron deficiency anemia secondary to blood loss (chronic): Secondary | ICD-10-CM

## 2021-09-25 DIAGNOSIS — E669 Obesity, unspecified: Secondary | ICD-10-CM

## 2021-09-25 DIAGNOSIS — E7800 Pure hypercholesterolemia, unspecified: Secondary | ICD-10-CM

## 2021-09-25 NOTE — Telephone Encounter (Signed)
Pt wanted to change the appt until Dec due to insurance may be not covering 65mearly. Appt scheduled for 12/19 at 3:45pm.  October appt canceled. Pt will come the Monday prior to appt for labs. Aware to come after 8:30am

## 2021-09-25 NOTE — Telephone Encounter (Signed)
She just had her physical in December with labs.  Just want to make sure she is not going to have an issue with her ins getting another 2 m early.  Labs are in though.

## 2021-10-10 ENCOUNTER — Other Ambulatory Visit (HOSPITAL_COMMUNITY): Payer: Self-pay | Admitting: Family Medicine

## 2021-10-10 DIAGNOSIS — Z1231 Encounter for screening mammogram for malignant neoplasm of breast: Secondary | ICD-10-CM

## 2021-10-15 ENCOUNTER — Encounter: Payer: BC Managed Care – PPO | Admitting: Family Medicine

## 2021-10-30 ENCOUNTER — Telehealth (INDEPENDENT_AMBULATORY_CARE_PROVIDER_SITE_OTHER): Payer: BC Managed Care – PPO | Admitting: Nurse Practitioner

## 2021-10-30 ENCOUNTER — Encounter: Payer: Self-pay | Admitting: Nurse Practitioner

## 2021-10-30 DIAGNOSIS — J069 Acute upper respiratory infection, unspecified: Secondary | ICD-10-CM

## 2021-10-30 MED ORDER — PSEUDOEPH-BROMPHEN-DM 30-2-10 MG/5ML PO SYRP: 5.0000 mL | ORAL_SOLUTION | Freq: Four times a day (QID) | ORAL | 0 refills | Status: DC | PRN

## 2021-10-30 NOTE — Patient Instructions (Signed)

## 2021-10-30 NOTE — Progress Notes (Signed)
   Virtual Visit  Note Due to COVID-19 pandemic this visit was conducted virtually. This visit type was conducted due to national recommendations for restrictions regarding the COVID-19 Pandemic (e.g. social distancing, sheltering in place) in an effort to limit this patient's exposure and mitigate transmission in our community. All issues noted in this document were discussed and addressed.  A physical exam was not performed with this format.  I connected with Janice Galvan on 10/30/21 at 12;00 pm  by telephone and verified that I am speaking with the correct person using two identifiers. Janice Galvan is currently located at home during visit. The provider, Ivy Lynn, NP is located in their office at time of visit.  I discussed the limitations, risks, security and privacy concerns of performing an evaluation and management service by telephone and the availability of in person appointments. I also discussed with the patient that there may be a patient responsible charge related to this service. The patient expressed understanding and agreed to proceed.   History and Present Illness:  URI  This is a new problem. The current episode started yesterday. The problem has been unchanged. There has been no fever. Associated symptoms include congestion. Pertinent negatives include no coughing, rash, rhinorrhea, sneezing, sore throat or swollen glands. She has tried nothing for the symptoms.      Review of Systems  Constitutional: Negative.  Negative for chills and fever.  HENT:  Positive for congestion. Negative for rhinorrhea, sneezing and sore throat.   Respiratory:  Negative for cough.   Cardiovascular: Negative.   Genitourinary: Negative.   Skin: Negative.  Negative for rash.  All other systems reviewed and are negative.    Observations/Objective: Televisit patient not in distress  Assessment and Plan: -Patient presents with symptoms of cough and congestion in the past 2  days.  Advised patient to take meds as prescribed - Use a cool mist humidifier  -Use saline nose sprays frequently -Force fluids -For fever or aches or pains- take Tylenol or ibuprofen. -If symptoms do not improve, she may need to be COVID tested to rule this out  Follow Up Instructions: Follow up with worsening unresolved symptoms     I discussed the assessment and treatment plan with the patient. The patient was provided an opportunity to ask questions and all were answered. The patient agreed with the plan and demonstrated an understanding of the instructions.   The patient was advised to call back or seek an in-person evaluation if the symptoms worsen or if the condition fails to improve as anticipated.  The above assessment and management plan was discussed with the patient. The patient verbalized understanding of and has agreed to the management plan. Patient is aware to call the clinic if symptoms persist or worsen. Patient is aware when to return to the clinic for a follow-up visit. Patient educated on when it is appropriate to go to the emergency department.   Time call ended:  12;12 pm  I provided 12 minutes of  non face-to-face time during this encounter.    Ivy Lynn, NP

## 2021-11-02 ENCOUNTER — Encounter: Payer: BC Managed Care – PPO | Admitting: Family Medicine

## 2021-11-28 ENCOUNTER — Ambulatory Visit (HOSPITAL_COMMUNITY)
Admission: RE | Admit: 2021-11-28 | Discharge: 2021-11-28 | Disposition: A | Discharge status: Home / Self Care | Payer: BC Managed Care – PPO | Source: Ambulatory Visit | Attending: Family Medicine | Admitting: Family Medicine

## 2021-11-28 DIAGNOSIS — Z1231 Encounter for screening mammogram for malignant neoplasm of breast: Secondary | ICD-10-CM | POA: Insufficient documentation

## 2022-01-14 ENCOUNTER — Other Ambulatory Visit: Payer: BC Managed Care – PPO

## 2022-01-14 DIAGNOSIS — E669 Obesity, unspecified: Secondary | ICD-10-CM

## 2022-01-14 DIAGNOSIS — E7800 Pure hypercholesterolemia, unspecified: Secondary | ICD-10-CM | POA: Diagnosis not present

## 2022-01-14 DIAGNOSIS — D500 Iron deficiency anemia secondary to blood loss (chronic): Secondary | ICD-10-CM

## 2022-01-14 LAB — BAYER DCA HB A1C WAIVED: HB A1C (BAYER DCA - WAIVED): 4.8 % (ref 4.8–5.6)

## 2022-01-15 ENCOUNTER — Encounter: Payer: Self-pay | Admitting: Family Medicine

## 2022-01-15 ENCOUNTER — Other Ambulatory Visit (HOSPITAL_COMMUNITY)
Admission: RE | Admit: 2022-01-15 | Discharge: 2022-01-15 | Disposition: A | Discharge status: Home / Self Care | Payer: BC Managed Care – PPO | Source: Ambulatory Visit | Attending: Family Medicine | Admitting: Family Medicine

## 2022-01-15 ENCOUNTER — Ambulatory Visit (INDEPENDENT_AMBULATORY_CARE_PROVIDER_SITE_OTHER): Payer: BC Managed Care – PPO | Admitting: Family Medicine

## 2022-01-15 VITALS — BP 119/74 | HR 93 | Temp 98.2°F | Ht 66.0 in | Wt 214.4 lb

## 2022-01-15 DIAGNOSIS — Z124 Encounter for screening for malignant neoplasm of cervix: Secondary | ICD-10-CM | POA: Diagnosis not present

## 2022-01-15 DIAGNOSIS — Z0000 Encounter for general adult medical examination without abnormal findings: Secondary | ICD-10-CM | POA: Diagnosis not present

## 2022-01-15 LAB — CMP14+EGFR
ALT: 13 IU/L (ref 0–32)
AST: 12 IU/L (ref 0–40)
Albumin/Globulin Ratio: 1.7 (ref 1.2–2.2)
Albumin: 4.3 g/dL (ref 3.9–4.9)
Alkaline Phosphatase: 76 IU/L (ref 44–121)
BUN/Creatinine Ratio: 9 (ref 9–23)
BUN: 8 mg/dL (ref 6–24)
Bilirubin Total: 0.7 mg/dL (ref 0.0–1.2)
CO2: 20 mmol/L (ref 20–29)
Calcium: 9.2 mg/dL (ref 8.7–10.2)
Chloride: 103 mmol/L (ref 96–106)
Creatinine, Ser: 0.85 mg/dL (ref 0.57–1.00)
Globulin, Total: 2.6 g/dL (ref 1.5–4.5)
Glucose: 100 mg/dL — ABNORMAL HIGH (ref 70–99)
Potassium: 4.2 mmol/L (ref 3.5–5.2)
Sodium: 138 mmol/L (ref 134–144)
Total Protein: 6.9 g/dL (ref 6.0–8.5)
eGFR: 87 mL/min/{1.73_m2} (ref >59)

## 2022-01-15 LAB — CBC
Hematocrit: 38 % (ref 34.0–46.6)
Hemoglobin: 12.3 g/dL (ref 11.1–15.9)
MCH: 29.4 pg (ref 26.6–33.0)
MCHC: 32.4 g/dL (ref 31.5–35.7)
MCV: 91 fL (ref 79–97)
Platelets: 409 10*3/uL (ref 150–450)
RBC: 4.19 x10E6/uL (ref 3.77–5.28)
RDW: 11.9 % (ref 11.7–15.4)
WBC: 4.2 10*3/uL (ref 3.4–10.8)

## 2022-01-15 LAB — LIPID PANEL
Chol/HDL Ratio: 2.6 ratio (ref 0.0–4.4)
Cholesterol, Total: 141 mg/dL (ref 100–199)
HDL: 54 mg/dL (ref >39)
LDL Chol Calc (NIH): 73 mg/dL (ref 0–99)
Triglycerides: 68 mg/dL (ref 0–149)
VLDL Cholesterol Cal: 14 mg/dL (ref 5–40)

## 2022-01-15 LAB — TSH: TSH: 2.8 u[IU]/mL (ref 0.450–4.500)

## 2022-01-15 NOTE — Progress Notes (Signed)
Janice Galvan is a 46 y.o. female presents to office today for annual physical exam examination.    Concerns today include: 1. None. Doing well.  Going to see the color purple musical and enjoy [place] food with her mom this weekend for Christmas.  Marital status: not sexually active, Substance use: none Diet: typical [place] , Exercise: no structured Last eye exam: UTD, now wearing bifocals Last dental exam: Up-to-date Last colonoscopy: Not yet due Last mammogram: Up-to-date Last pap smear: Needs Refills needed today: None Immunizations needed: Immunization History  Administered Date(s) Administered   Moderna Sars-Covid-2 Vaccination 05/13/2019, 06/10/2019, 02/07/2020   Tdap 10/27/2017     Past Medical History:  Diagnosis Date   Anemia    HSV-1 (herpes simplex virus 1) infection    Social History   Socioeconomic History   Marital status: Single    Spouse name: Not on file   Number of children: Not on file   Years of education: Not on file   Highest education level: Not on file  Occupational History   Not on file  Tobacco Use   Smoking status: Never   Smokeless tobacco: Never  Vaping Use   Vaping Use: Never used  Substance and Sexual Activity   Alcohol use: Yes    Comment: occ   Drug use: No   Sexual activity: Not Currently    Birth control/protection: None  Other Topics Concern   Not on file  Social History Narrative   Not on file   Social Determinants of Health   Financial Resource Strain: Not on file  Food Insecurity: Not on file  Transportation Needs: Not on file  Physical Activity: Not on file  Stress: Not on file  Social Connections: Not on file  Intimate Partner Violence: Not on file   Past Surgical History:  Procedure Laterality Date   ANTERIOR CRUCIATE LIGAMENT REPAIR Left 2001   BIOPSY  01/26/2018   Procedure: BIOPSY;  Surgeon: Danie Binder, MD;  [facility]: [facility];  Service: Endoscopy;;  gastric and duodenuem    COLONOSCOPY N/A 01/26/2018   two 3-4 mm polyps in sigmoid colon (hyperplastic), internal hemorrhoids. Next age 62-50   ENDOMETRIAL ABLATION  02/2017   [place], [place]   ESOPHAGOGASTRODUODENOSCOPY N/A 01/26/2018   EGD with few gastric polyps, fundic gland. Gastritis, normal small bowel biopsies   POLYPECTOMY  01/26/2018   Procedure: POLYPECTOMY;  Surgeon: Danie Binder, MD;  Location: [facility];  Service: Endoscopy;;  sigmoid   Family History  Problem Relation Age of Onset   Cancer Father        lung   Schizophrenia Brother    Diabetes Brother    Hypertension Brother    Cancer Maternal Grandmother    Colon cancer Neg Hx    Colon polyps Neg Hx     Current Outpatient Medications:    brompheniramine-pseudoephedrine-DM 30-2-10 MG/5ML syrup, Take 5 mLs by mouth 4 (four) times daily as needed., Disp: 120 mL, Rfl: 0  No Known Allergies   ROS: Review of Systems A comprehensive review of systems was negative except for: Eyes: positive for contacts/glasses    Physical exam BP 119/74   Pulse 93   Temp 98.2 F (36.8 C)   Ht _0  (1.676 m)   Wt 214 lb 6.4 oz (97.3 kg)   LMP 12/07/2021   SpO2 100%   BMI 34.61 kg/m  General appearance: alert, cooperative, appears stated age, no distress, and morbidly obese Head: Normocephalic, without obvious abnormality,  atraumatic Eyes: negative findings: lids and lashes normal, conjunctivae and sclerae normal, corneas clear, and pupils equal, round, reactive to light and accomodation Ears: normal TM's and external ear canals both ears Nose: Nares normal. Septum midline. Mucosa normal. No drainage or sinus tenderness. Throat: lips, mucosa, and tongue normal; teeth and gums normal Neck: no adenopathy, supple, symmetrical, trachea midline, and thyroid not enlarged, symmetric, no tenderness/mass/nodules Back: symmetric, no curvature. ROM normal. No CVA tenderness. Lungs: clear to auscultation bilaterally Heart: regular rate and rhythm, S1, S2  normal, no murmur, click, rub or gallop Abdomen: soft, non-tender; bowel sounds normal; no masses,  no organomegaly Pelvic: cervix normal in appearance, external genitalia normal, no adnexal masses or tenderness, no cervical motion tenderness, rectovaginal septum normal, uterus normal size, shape, and consistency, vagina normal without discharge, and cervix slightly tilted to pt right. Extremities: extremities normal, atraumatic, no cyanosis or edema Pulses: 2+ and symmetric Skin: Skin color, texture, turgor normal. No rashes or lesions Lymph nodes: Cervical, supraclavicular, and axillary nodes normal. Neurologic: Grossly normal Psych: Mood stable, speech normal, affect appropriate.  Very pleasant, interactive  Results for orders placed or performed in visit on 01/14/22 (from the past 48 hour(s))  Bayer DCA Hb A1c Waived     Status: None   Collection Time: 01/14/22  8:17 AM  Result Value Ref Range   HB A1C (BAYER DCA - WAIVED) 4.8 4.8 - 5.6 %    Comment:          Prediabetes: 5.7 - 6.4          Diabetes: >6.4          Glycemic control for adults with diabetes: <7.0   CMP14+EGFR     Status: Abnormal   Collection Time: 01/14/22  8:46 AM  Result Value Ref Range   Glucose 100 (H) 70 - 99 mg/dL   BUN 8 6 - 24 mg/dL   Creatinine, Ser 0.85 0.57 - 1.00 mg/dL   eGFR 87 >59 mL/min/1.73   BUN/Creatinine Ratio 9 9 - 23   Sodium 138 134 - 144 mmol/L   Potassium 4.2 3.5 - 5.2 mmol/L   Chloride 103 96 - 106 mmol/L   CO2 20 20 - 29 mmol/L   Calcium 9.2 8.7 - 10.2 mg/dL   Total Protein 6.9 6.0 - 8.5 g/dL   Albumin 4.3 3.9 - 4.9 g/dL   Globulin, Total 2.6 1.5 - 4.5 g/dL   Albumin/Globulin Ratio 1.7 1.2 - 2.2   Bilirubin Total 0.7 0.0 - 1.2 mg/dL   Alkaline Phosphatase 76 44 - 121 IU/L   AST 12 0 - 40 IU/L   ALT 13 0 - 32 IU/L  CBC     Status: None   Collection Time: 01/14/22  8:46 AM  Result Value Ref Range   WBC 4.2 3.4 - 10.8 x10E3/uL   RBC 4.19 3.77 - 5.28 x10E6/uL   Hemoglobin 12.3  11.1 - 15.9 g/dL   Hematocrit 38.0 34.0 - 46.6 %   MCV 91 79 - 97 fL   MCH 29.4 26.6 - 33.0 pg   MCHC 32.4 31.5 - 35.7 g/dL   RDW 11.9 11.7 - 15.4 %   Platelets 409 150 - 450 x10E3/uL  TSH     Status: None   Collection Time: 01/14/22  8:46 AM  Result Value Ref Range   TSH 2.800 0.450 - 4.500 uIU/mL  Lipid panel     Status: None   Collection Time: 01/14/22  8:46 AM  Result Value  Ref Range   Cholesterol, Total 141 100 - 199 mg/dL   Triglycerides 68 0 - 149 mg/dL   HDL 54 >39 mg/dL   VLDL Cholesterol Cal 14 5 - 40 mg/dL   LDL Chol Calc (NIH) 73 0 - 99 mg/dL   Chol/HDL Ratio 2.6 0.0 - 4.4 ratio    Comment:                                   T. Chol/HDL Ratio                                             Men  Women                               1/2 Avg.Risk  3.4    3.3                                   Avg.Risk  5.0    4.4                                2X Avg.Risk  9.6    7.1                                3X Avg.Risk 23.4   11.0    Assessment/ Plan: Janice Galvan here for annual physical exam.   Annual physical exam  Screening for malignant neoplasm of cervix - Plan: Cytology - PAP([place])  Morbid obesity ([place])  Pap smear completed.  Doing really well.  Reviewed all of her labs today.  Cholesterol back in normal range.  Continue lifestyle modification, increase physical exercise with balanced diet.    The 10-year ASCVD risk score (Arnett DK, et al., 2019) is: 0.5%   Values used to calculate the score:     Age: 89 years     Sex: Female     Is Non-Hispanic African American: Yes     Diabetic: No     Tobacco smoker: No     Systolic Blood Pressure: 785 mmHg     Is BP treated: No     HDL Cholesterol: 54 mg/dL     Total Cholesterol: 141 mg/dL  May follow-up in 1 year, sooner if concerns arise  Counseled on healthy lifestyle choices, including diet (rich in fruits, vegetables and lean meats and low in salt and simple carbohydrates) and exercise (at least 30 minutes of  moderate physical activity daily).  Patient to follow up in 1 year for annual exam or sooner if needed.  Renji Berwick M. Lajuana Ripple, DO

## 2022-01-22 LAB — CYTOLOGY - PAP
Adequacy: ABSENT
Comment: NEGATIVE
Diagnosis: NEGATIVE
High risk HPV: NEGATIVE

## 2022-07-05 IMAGING — MG MM DIGITAL SCREENING BILAT W/ TOMO AND CAD
8 series · 8 of 24 positions shown · non-contrast
Comparison: Previous exam(s).

CLINICAL DATA: Screening.

EXAM:
DIGITAL SCREENING BILATERAL MAMMOGRAM WITH TOMOSYNTHESIS AND CAD
TECHNIQUE: Bilateral screening digital craniocaudal and mediolateral oblique
mammograms were obtained. Bilateral screening digital breast
tomosynthesis was performed. The images were evaluated with
computer-aided detection.

[L MLO synth-2D]
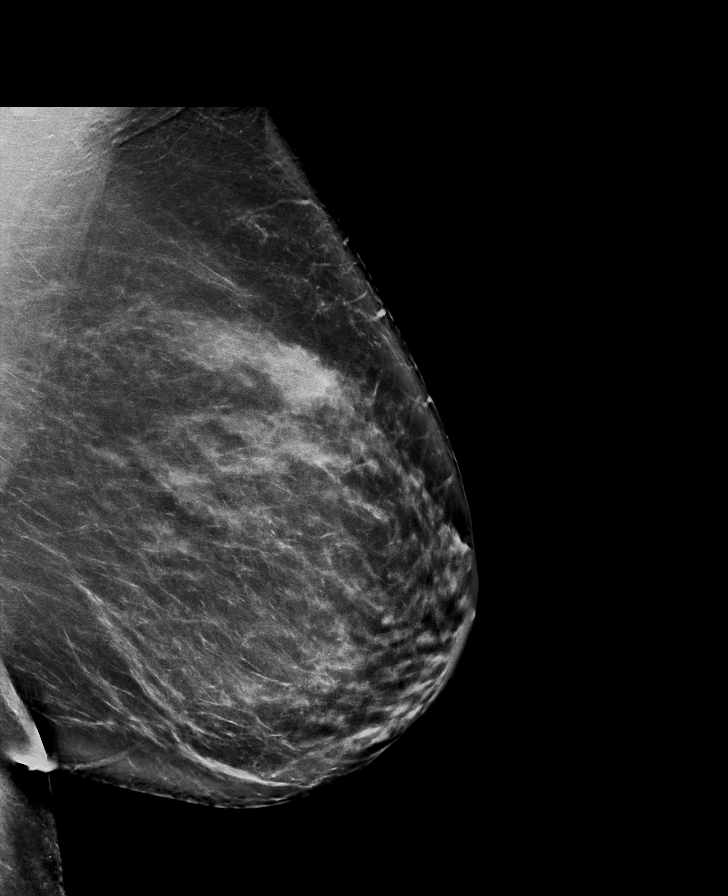

[R CC synth-2D]
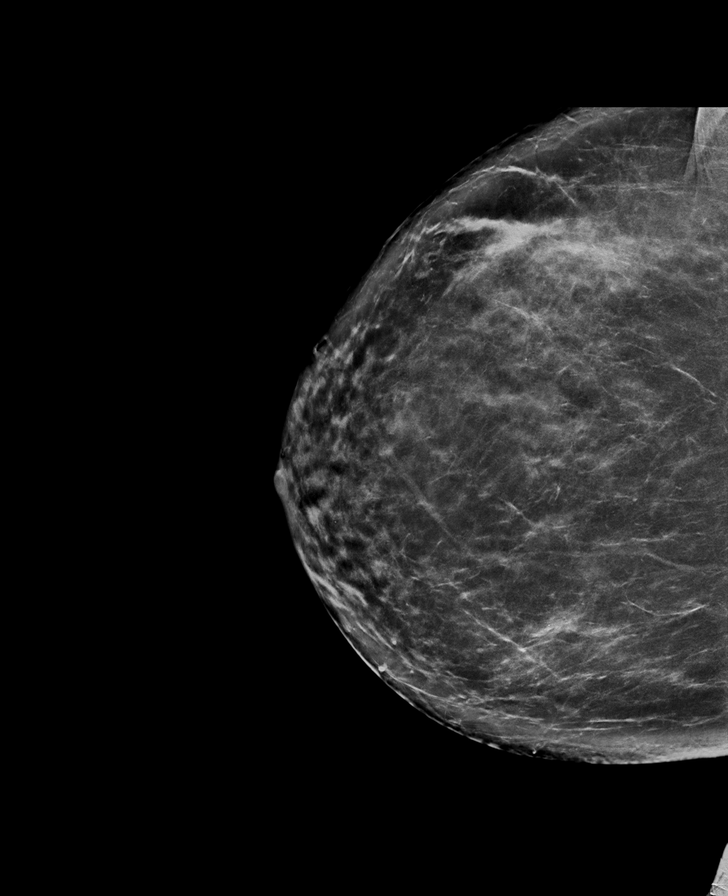

[L CC synth-2D]
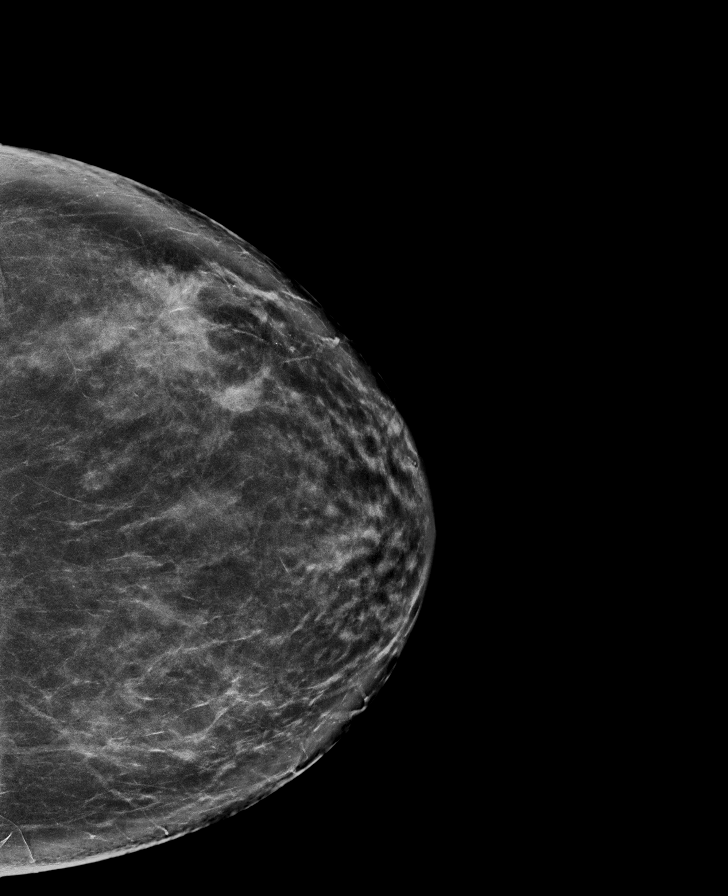

[R MLO synth-2D]
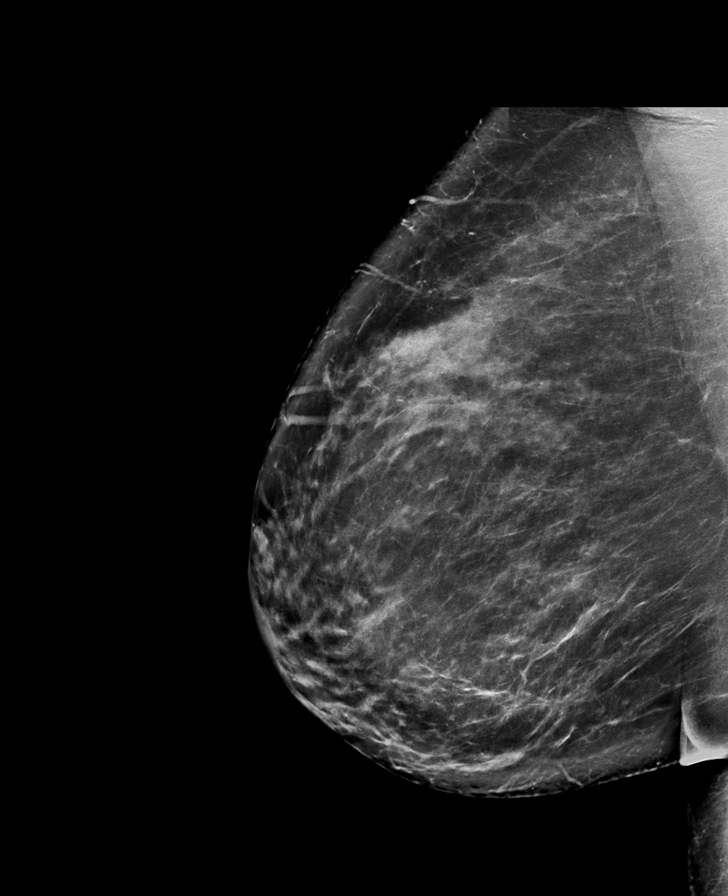

[R MLO tomo · tomo slice 52/103.0]
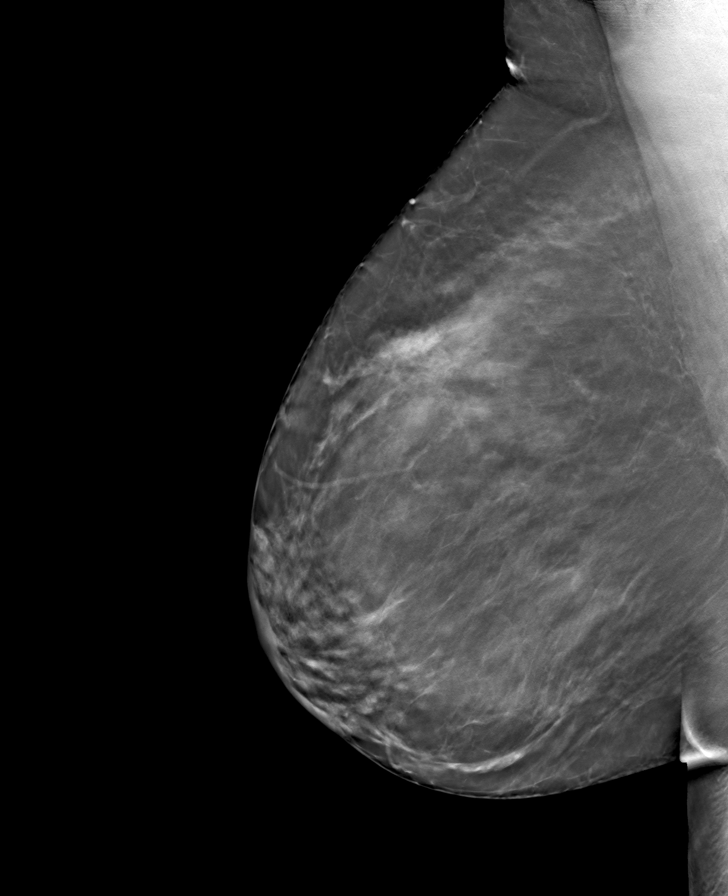

[R CC tomo · tomo slice 49/97.0]
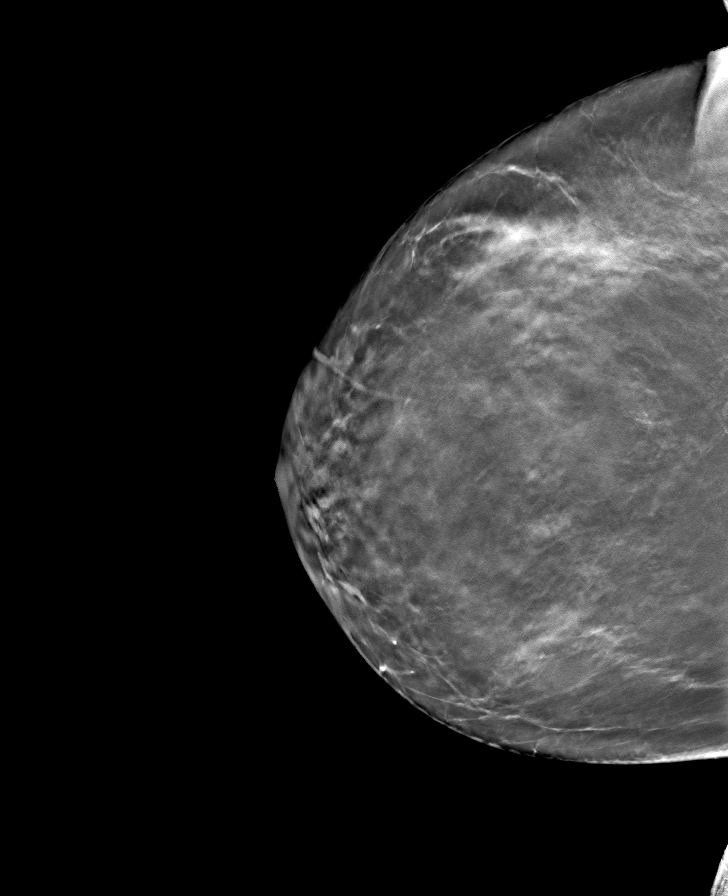

[L CC tomo · tomo slice 49/97.0]
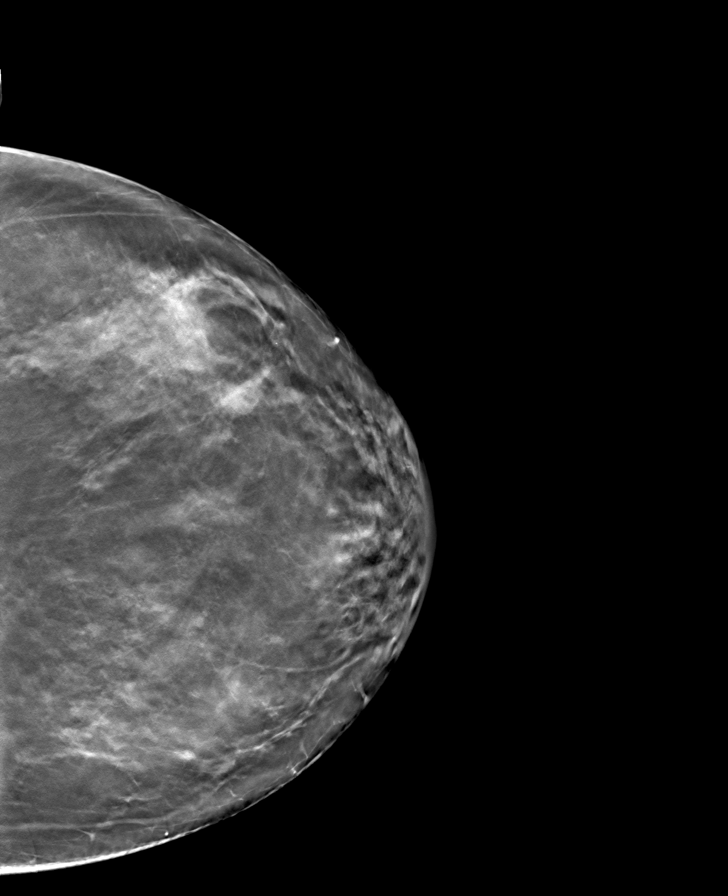

[L MLO tomo · tomo slice 53/105.0]
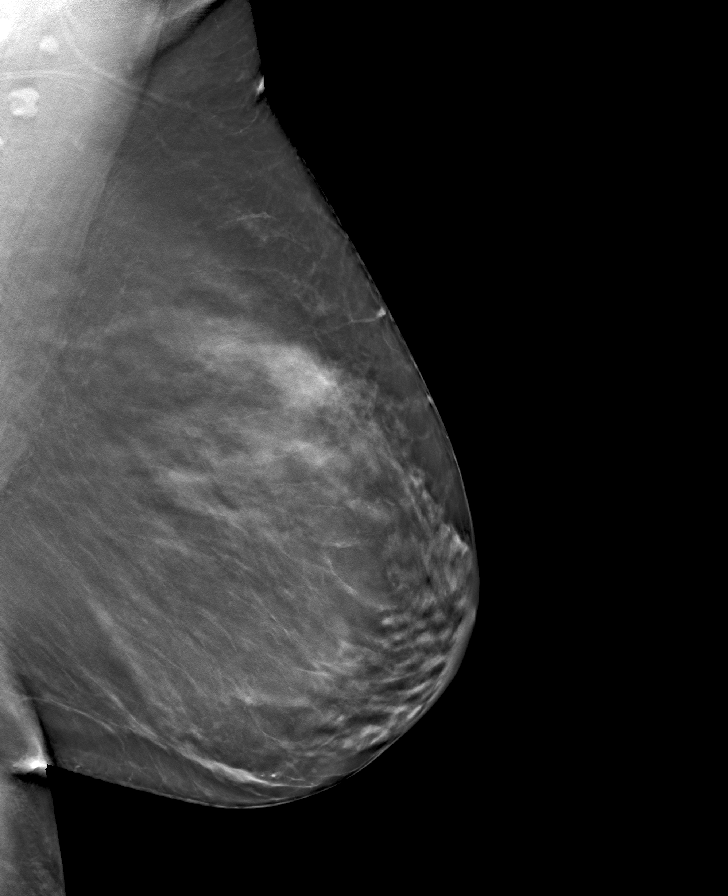

[8 of 24 positions shown; findings below may reference images not displayed]

ACR Breast Density Category c: The breast tissue is heterogeneously
dense, which may obscure small masses.
FINDINGS: There are no findings suspicious for malignancy.
IMPRESSION: No mammographic evidence of malignancy. A result letter of this
screening mammogram will be mailed directly to the patient.

RECOMMENDATION:
Screening mammogram in one year. (Code:Q3-W-BC3)

BI-RADS CATEGORY  1: Negative.

## 2022-11-06 ENCOUNTER — Other Ambulatory Visit (HOSPITAL_COMMUNITY): Payer: Self-pay | Admitting: Family Medicine

## 2022-11-06 DIAGNOSIS — Z1231 Encounter for screening mammogram for malignant neoplasm of breast: Secondary | ICD-10-CM

## 2022-12-02 ENCOUNTER — Encounter (HOSPITAL_COMMUNITY): Payer: Self-pay | Admitting: Radiology

## 2022-12-02 ENCOUNTER — Ambulatory Visit (HOSPITAL_COMMUNITY)
Admission: RE | Admit: 2022-12-02 | Discharge: 2022-12-02 | Disposition: A | Discharge status: Home / Self Care | Payer: BC Managed Care – PPO | Source: Ambulatory Visit | Attending: Family Medicine | Admitting: Family Medicine

## 2022-12-02 DIAGNOSIS — H35413 Lattice degeneration of retina, bilateral: Secondary | ICD-10-CM | POA: Diagnosis not present

## 2022-12-02 DIAGNOSIS — Z1231 Encounter for screening mammogram for malignant neoplasm of breast: Secondary | ICD-10-CM | POA: Diagnosis not present

## 2022-12-02 DIAGNOSIS — H43823 Vitreomacular adhesion, bilateral: Secondary | ICD-10-CM | POA: Diagnosis not present

## 2023-01-14 ENCOUNTER — Encounter (INDEPENDENT_AMBULATORY_CARE_PROVIDER_SITE_OTHER): Payer: Self-pay | Admitting: *Deleted

## 2023-02-10 ENCOUNTER — Telehealth: Payer: Self-pay | Admitting: *Deleted

## 2023-02-10 DIAGNOSIS — Z860100 Personal history of colon polyps, unspecified: Secondary | ICD-10-CM

## 2023-02-10 NOTE — Telephone Encounter (Signed)
  Procedure: Colonoscopy  Estimated body mass index is 34.61 kg/m as calculated from the following:   Height as of 01/15/22: 5' 6 (1.676 m).   Weight as of 01/15/22: 214 lb 6.4 oz (97.3 kg).   Have you had a colonoscopy before?  01/26/18, Dr. Harvey  Do you have family history of colon cancer?  no  Do you have a family history of polyps? no  Previous colonoscopy with polyps removed? no  Do you have a history colorectal cancer?   no  Are you diabetic?  no  Do you have a prosthetic or mechanical heart valve? no  Do you have a pacemaker/defibrillator?   no  Have you had endocarditis/atrial fibrillation?  no  Do you use supplemental oxygen/CPAP?  no  Have you had joint replacement within the last 12 months?  no  Do you tend to be constipated or have to use laxatives?  no   Do you have history of alcohol use? If yes, how much and how often.  Occas once a month  Do you have history or are you using drugs? If yes, what do are you  using?  no  Have you ever had a stroke/heart attack?  no  Have you ever had a heart or other vascular stent placed,?no  Do you take weight loss medication? no  female patients,: have you had a hysterectomy? no                              are you post menopausal?  no                              do you still have your menstrual cycle? yes    Date of last menstrual period?   Do you take any blood-thinning medications such as: (Plavix, aspirin, Coumadin, Aggrenox, Brilinta, Xarelto, Eliquis, Pradaxa, Savaysa or Effient)? no  If yes we need the name, milligram, dosage and who is prescribing doctor:               Listed not taking any meds   No Known Allergies

## 2023-03-05 NOTE — Telephone Encounter (Signed)
Ok to schedule. ASA 2. ?Needs preg test. ?

## 2023-03-06 MED ORDER — PEG 3350-KCL-NA BICARB-NACL 420 G PO SOLR: 4000.0000 mL | Freq: Once | ORAL | 0 refills | Status: AC

## 2023-03-06 NOTE — Telephone Encounter (Signed)
 Questionnaire from recall, no referral needed

## 2023-03-06 NOTE — Telephone Encounter (Signed)
 Spoke with pt. She has been scheduled for 3/11 with Dr. Mordechai April. Aware will send instructions. Rx for prep sent to pharmacy

## 2023-03-06 NOTE — Addendum Note (Signed)
 Addended by: Feliz Hosteller on: 03/06/2023 01:34 PM   Modules accepted: Orders

## 2023-03-10 DIAGNOSIS — H43823 Vitreomacular adhesion, bilateral: Secondary | ICD-10-CM | POA: Diagnosis not present

## 2023-03-10 DIAGNOSIS — H35413 Lattice degeneration of retina, bilateral: Secondary | ICD-10-CM | POA: Diagnosis not present

## 2023-03-10 DIAGNOSIS — H43391 Other vitreous opacities, right eye: Secondary | ICD-10-CM | POA: Diagnosis not present

## 2023-03-31 ENCOUNTER — Encounter: Payer: BC Managed Care – PPO | Admitting: Family Medicine

## 2023-04-01 NOTE — Telephone Encounter (Signed)
 Pt called and was asking if her procedure was a preventative procedure. Advised pt that she has a history of polyps therefore it will be a diagnostic colonoscopy. Advised pt that she does not have to pay anything up front that to bill her insurance first. Pt verbalized understanding.

## 2023-04-08 ENCOUNTER — Encounter (HOSPITAL_COMMUNITY): Payer: Self-pay | Admitting: Internal Medicine

## 2023-04-08 ENCOUNTER — Encounter (HOSPITAL_COMMUNITY)
Admission: RE | Disposition: A | Discharge status: Home / Self Care | Payer: Self-pay | Source: Home / Self Care | Attending: Internal Medicine

## 2023-04-08 ENCOUNTER — Ambulatory Visit (HOSPITAL_COMMUNITY)
Admission: RE | Admit: 2023-04-08 | Discharge: 2023-04-08 | Disposition: A | Discharge status: Home / Self Care | Payer: BC Managed Care – PPO | Attending: Internal Medicine | Admitting: Internal Medicine

## 2023-04-08 ENCOUNTER — Ambulatory Visit (HOSPITAL_COMMUNITY): Admitting: Anesthesiology

## 2023-04-08 ENCOUNTER — Other Ambulatory Visit: Payer: Self-pay

## 2023-04-08 DIAGNOSIS — K635 Polyp of colon: Secondary | ICD-10-CM

## 2023-04-08 DIAGNOSIS — I10 Essential (primary) hypertension: Secondary | ICD-10-CM | POA: Insufficient documentation

## 2023-04-08 DIAGNOSIS — Z1211 Encounter for screening for malignant neoplasm of colon: Secondary | ICD-10-CM

## 2023-04-08 DIAGNOSIS — D649 Anemia, unspecified: Secondary | ICD-10-CM | POA: Insufficient documentation

## 2023-04-08 DIAGNOSIS — Q438 Other specified congenital malformations of intestine: Secondary | ICD-10-CM | POA: Diagnosis not present

## 2023-04-08 HISTORY — PX: POLYPECTOMY: SHX5525

## 2023-04-08 HISTORY — PX: COLONOSCOPY WITH PROPOFOL: SHX5780

## 2023-04-08 LAB — POCT PREGNANCY, URINE: Preg Test, Ur: NEGATIVE

## 2023-04-08 SURGERY — COLONOSCOPY WITH PROPOFOL
Anesthesia: General

## 2023-04-08 MED ORDER — DEXMEDETOMIDINE HCL IN NACL 80 MCG/20ML IV SOLN
INTRAVENOUS | Status: DC | PRN
  Administered 2023-04-08: 4 ug via INTRAVENOUS

## 2023-04-08 MED ORDER — PROPOFOL 500 MG/50ML IV EMUL
INTRAVENOUS | Status: DC | PRN
  Administered 2023-04-08: 150 ug/kg/min via INTRAVENOUS

## 2023-04-08 MED ORDER — PHENYLEPHRINE 80 MCG/ML (10ML) SYRINGE FOR IV PUSH (FOR BLOOD PRESSURE SUPPORT)
PREFILLED_SYRINGE | INTRAVENOUS | Status: DC | PRN
  Administered 2023-04-08: 160 ug via INTRAVENOUS

## 2023-04-08 MED ORDER — PROPOFOL 10 MG/ML IV BOLUS
INTRAVENOUS | Status: DC | PRN
  Administered 2023-04-08: 80 mg via INTRAVENOUS

## 2023-04-08 MED ORDER — LACTATED RINGERS IV SOLN: INTRAVENOUS | Status: DC

## 2023-04-08 MED ORDER — LIDOCAINE HCL (PF) 2 % IJ SOLN
INTRAMUSCULAR | Status: AC
  Filled 2023-04-08: qty 5

## 2023-04-08 MED ORDER — LIDOCAINE HCL (CARDIAC) PF 100 MG/5ML IV SOSY
PREFILLED_SYRINGE | INTRAVENOUS | Status: DC | PRN
  Administered 2023-04-08: 60 mg via INTRATRACHEAL

## 2023-04-08 NOTE — Discharge Instructions (Addendum)
  Colonoscopy Discharge Instructions  Read the instructions outlined below and refer to this sheet in the next few weeks. These discharge instructions provide you with general information on caring for yourself after you leave the hospital. Your doctor may also give you specific instructions. While your treatment has been planned according to the most current medical practices available, unavoidable complications occasionally occur.   ACTIVITY You may resume your regular activity, but move at a slower pace for the next 24 hours.  Take frequent rest periods for the next 24 hours.  Walking will help get rid of the air and reduce the bloated feeling in your belly (abdomen).  No driving for 24 hours (because of the medicine (anesthesia) used during the test).   Do not sign any important legal documents or operate any machinery for 24 hours (because of the anesthesia used during the test).  NUTRITION Drink plenty of fluids.  You may resume your normal diet as instructed by your doctor.  Begin with a light meal and progress to your normal diet. Heavy or fried foods are harder to digest and may make you feel sick to your stomach (nauseated).  Avoid alcoholic beverages for 24 hours or as instructed.  MEDICATIONS You may resume your normal medications unless your doctor tells you otherwise.  WHAT YOU CAN EXPECT TODAY Some feelings of bloating in the abdomen.  Passage of more gas than usual.  Spotting of blood in your stool or on the toilet paper.  IF YOU HAD POLYPS REMOVED DURING THE COLONOSCOPY: No aspirin products for 7 days or as instructed.  No alcohol for 7 days or as instructed.  Eat a soft diet for the next 24 hours.  FINDING OUT THE RESULTS OF YOUR TEST Not all test results are available during your visit. If your test results are not back during the visit, make an appointment with your caregiver to find out the results. Do not assume everything is normal if you have not heard from your  caregiver or the medical facility. It is important for you to follow up on all of your test results.  SEEK IMMEDIATE MEDICAL ATTENTION IF: You have more than a spotting of blood in your stool.  Your belly is swollen (abdominal distention).  You are nauseated or vomiting.  You have a temperature over 101.  You have abdominal pain or discomfort that is severe or gets worse throughout the day.   Your colonoscopy revealed 2 polyp(s) which I removed successfully. Await pathology results, my office will contact you. I recommend repeating colonoscopy in 7-10 years for surveillance purposes depending on pathology results.    Otherwise follow up with GI as needed.   I hope you have a great rest of your week!  Elon Alas. Abbey Chatters, D.O. Gastroenterology and Hepatology [facility]

## 2023-04-08 NOTE — Anesthesia Preprocedure Evaluation (Signed)
 Anesthesia Evaluation  Patient identified by MRN, date of birth, ID band Patient awake    Reviewed: Allergy & Precautions, H&P , NPO status , Patient's Chart, lab work & pertinent test results, reviewed documented beta blocker date and time   Airway Mallampati: II  TM Distance: >3 FB Neck ROM: full    Dental no notable dental hx.    Pulmonary neg pulmonary ROS   Pulmonary exam normal breath sounds clear to auscultation       Cardiovascular Exercise Tolerance: Good hypertension, negative cardio ROS  Rhythm:regular Rate:Normal     Neuro/Psych  Neuromuscular disease negative neurological ROS  negative psych ROS   GI/Hepatic negative GI ROS, Neg liver ROS,,,  Endo/Other  negative endocrine ROS    Renal/GU negative Renal ROS  negative genitourinary   Musculoskeletal   Abdominal   Peds  Hematology negative hematology ROS (+) Blood dyscrasia, anemia   Anesthesia Other Findings   Reproductive/Obstetrics negative OB ROS                             Anesthesia Physical Anesthesia Plan  ASA: 2  Anesthesia Plan: General   Post-op Pain Management:    Induction:   PONV Risk Score and Plan: Propofol infusion  Airway Management Planned:   Additional Equipment:   Intra-op Plan:   Post-operative Plan:   Informed Consent: I have reviewed the patients History and Physical, chart, labs and discussed the procedure including the risks, benefits and alternatives for the proposed anesthesia with the patient or authorized representative who has indicated his/her understanding and acceptance.     Dental Advisory Given  Plan Discussed with: CRNA  Anesthesia Plan Comments:        Anesthesia Quick Evaluation

## 2023-04-08 NOTE — Transfer of Care (Addendum)
 Immediate Anesthesia Transfer of Care Note  Patient: Janice Galvan  Procedure(s) Performed: COLONOSCOPY WITH PROPOFOL POLYPECTOMY  Patient Location: Endoscopy Unit  Anesthesia Type:General  Level of Consciousness: drowsy and patient cooperative  Airway & Oxygen Therapy: Patient Spontanous Breathing and Patient connected to nasal cannula oxygen  Post-op Assessment: Report given to RN and Post -op Vital signs reviewed and stable  Post vital signs: Reviewed   Last Vitals:  Vitals Value Taken Time  BP 93/45 04/08/23   0857  Temp 36.5 04/08/23   0857  Pulse 62 04/08/23   0857  Resp 19 04/08/23   0857  SpO2 97% 04/08/23   0857    Last Pain:  Vitals:   04/08/23 0831  TempSrc:   PainSc: 0-No pain      Patients Stated Pain Goal: 8 (04/08/23 0802)  Complications: No notable events documented.

## 2023-04-08 NOTE — H&P (Signed)
 Primary Care Physician:  Raliegh Ip, DO Primary Gastroenterologist:  Dr. Marletta Lor  Pre-Procedure History & Physical: HPI:  Janice Galvan is a 48 y.o. female is here for a colonoscopy for colon cancer screening purposes.  Patient denies any family history of colorectal cancer.    Past Medical History:  Diagnosis Date   Anemia    HSV-1 (herpes simplex virus 1) infection     Past Surgical History:  Procedure Laterality Date   ANTERIOR CRUCIATE LIGAMENT REPAIR Left 2001   BIOPSY  01/26/2018   Procedure: BIOPSY;  Surgeon: West Bali, MD;  Location: [facility];  Service: Endoscopy;;  gastric and duodenuem   COLONOSCOPY N/A 01/26/2018   two 3-4 mm polyps in sigmoid colon (hyperplastic), internal hemorrhoids. Next age 15-50   ENDOMETRIAL ABLATION  02/2017   [place], [place]   ESOPHAGOGASTRODUODENOSCOPY N/A 01/26/2018   EGD with few gastric polyps, fundic gland. Gastritis, normal small bowel biopsies   POLYPECTOMY  01/26/2018   Procedure: POLYPECTOMY;  Surgeon: West Bali, MD;  Location: [facility];  Service: Endoscopy;;  sigmoid    Prior to Admission medications   Not on File    Allergies as of 03/06/2023   (No Known Allergies)    Family History  Problem Relation Age of Onset   Cancer Father        lung   Schizophrenia Brother    Diabetes Brother    Hypertension Brother    Cancer Maternal Grandmother    Colon cancer Neg Hx    Colon polyps Neg Hx     Social History   Socioeconomic History   Marital status: Single    Spouse name: Not on file   Number of children: Not on file   Years of education: Not on file   Highest education level: Not on file  Occupational History   Not on file  Tobacco Use   Smoking status: Never   Smokeless tobacco: Never  Vaping Use   Vaping status: Never Used  Substance and Sexual Activity   Alcohol use: Yes    Comment: occ   Drug use: No   Sexual activity: Not Currently    Birth control/protection: None  Other  Topics Concern   Not on file  Social History Narrative   Not on file   Social Drivers of Health   Financial Resource Strain: Not on file  Food Insecurity: Not on file  Transportation Needs: Not on file  Physical Activity: Not on file  Stress: Not on file  Social Connections: Not on file  Intimate Partner Violence: Not on file    Review of Systems: See HPI, otherwise negative ROS  Physical Exam: Vital signs in last 24 hours: Temp:  [98.5 F (36.9 C)] 98.5 F (36.9 C) (03/11 0802) Pulse Rate:  [72] 72 (03/11 0802) Resp:  [15] 15 (03/11 0802) BP: (111)/(76) 111/76 (03/11 0802) SpO2:  [99 %] 99 % (03/11 0802) Weight:  [104.3 kg] 104.3 kg (03/11 0802)   General:   Alert,  Well-developed, well-nourished, pleasant and cooperative in NAD Head:  Normocephalic and atraumatic. Eyes:  Sclera clear, no icterus.   Conjunctiva pink. Ears:  Normal auditory acuity. Nose:  No deformity, discharge,  or lesions. Msk:  Symmetrical without gross deformities. Normal posture. Extremities:  Without clubbing or edema. Neurologic:  Alert and  oriented x4;  grossly normal neurologically. Skin:  Intact without significant lesions or rashes. Psych:  Alert and cooperative. Normal mood and affect.  Impression/Plan: Janice Galvan  is here for a colonoscopy to be performed for colon cancer screening purposes.  The risks of the procedure including infection, bleed, or perforation as well as benefits, limitations, alternatives and imponderables have been reviewed with the patient. Questions have been answered. All parties agreeable.

## 2023-04-08 NOTE — Op Note (Signed)
 [facility] Patient Name: Janice Galvan Procedure Date: 04/08/2023 8:19 AM MRN: [id number] Date of Birth: [birth year] Attending MD: Hennie Duos. Marletta Lor , [place], [id number] CSN: [id number] Age: 48 Admit Type: Outpatient Procedure:                Colonoscopy Indications:              Screening for colorectal malignant neoplasm Providers:                Hennie Duos. Marletta Lor, DO, Crystal Page, Lennice Sites                            Technician, Technician Referring MD:              Medicines:                See the Anesthesia note for documentation of the                            administered medications Complications:            No immediate complications. Estimated Blood Loss:     Estimated blood loss was minimal. Procedure:                Pre-Anesthesia Assessment:                           - The anesthesia plan was to use monitored                            anesthesia care (MAC).                           After obtaining informed consent, the colonoscope                            was passed under direct vision. Throughout the                            procedure, the patient's blood pressure, pulse, and                            oxygen saturations were monitored continuously. The                            [id number]-HQ190L ([id number]) scope was introduced through                            the anus and advanced to the the cecum, identified                            by appendiceal orifice and ileocecal valve. The                            colonoscopy was somewhat difficult due to a                            redundant colon and significant  looping. The                            patient tolerated the procedure well. The quality                            of the bowel preparation was evaluated using the                            BBPS [facility] Bowel Preparation Scale) with scores                            of: Right Colon = 3, Transverse Colon = 3 and Left                             Colon = 3 (entire mucosa seen well with no residual                            staining, small fragments of stool or opaque                            liquid). The total BBPS score equals 9. Scope In: 8:35:36 AM Scope Out: 8:54:01 AM Scope Withdrawal Time: 0 hours 12 minutes 34 seconds  Total Procedure Duration: 0 hours 18 minutes 25 seconds  Findings:      Two sessile polyps were found in the sigmoid colon. The polyps were 4 to       6 mm in size. These polyps were removed with a cold snare. Resection and       retrieval were complete.      The exam was otherwise without abnormality. Impression:               - Two 4 to 6 mm polyps in the sigmoid colon,                            removed with a cold snare. Resected and retrieved.                           - The examination was otherwise normal. Moderate Sedation:      Per Anesthesia Care Recommendation:           - Patient has a contact number available for                            emergencies. The signs and symptoms of potential                            delayed complications were discussed with the                            patient. Return to normal activities tomorrow.                            Written discharge instructions were provided to the  patient.                           - Resume previous diet.                           - Continue present medications.                           - Await pathology results.                           - Repeat colonoscopy in 7-10 years for surveillance.                           - Return to GI clinic PRN. Procedure Code(s):        --- Professional ---                           [phone], Colonoscopy, flexible; with removal of                            tumor(s), polyp(s), or other lesion(s) by snare                            technique Diagnosis Code(s):        --- Professional ---                           Z12.11, Encounter for screening for malignant                             neoplasm of colon                           D12.5, Benign neoplasm of sigmoid colon CPT copyright 2022 American Medical Association. All rights reserved. The codes documented in this report are preliminary and upon coder review may  be revised to meet current compliance requirements. Hennie Duos. Marletta Lor, DO Hennie Duos. Marletta Lor, DO 04/08/2023 8:56:32 AM This report has been signed electronically. Number of Addenda: 0

## 2023-04-09 ENCOUNTER — Encounter (HOSPITAL_COMMUNITY): Payer: Self-pay | Admitting: Internal Medicine

## 2023-04-09 LAB — SURGICAL PATHOLOGY

## 2023-04-12 NOTE — Anesthesia Postprocedure Evaluation (Signed)
 Anesthesia Post Note  Patient: Janice Galvan  Procedure(s) Performed: COLONOSCOPY WITH PROPOFOL POLYPECTOMY  Patient location during evaluation: Phase II Anesthesia Type: General Level of consciousness: awake Pain management: pain level controlled Vital Signs Assessment: post-procedure vital signs reviewed and stable Respiratory status: spontaneous breathing and respiratory function stable Cardiovascular status: blood pressure returned to baseline and stable Postop Assessment: no headache and no apparent nausea or vomiting Anesthetic complications: no Comments: Late entry   No notable events documented.   Last Vitals:  Vitals:   04/08/23 0857 04/08/23 0904  BP: (!) 93/45 (!) 99/57  Pulse: 62   Resp: 19   Temp: 36.5 C   SpO2: 97%     Last Pain:  Vitals:   04/08/23 0857  TempSrc: Oral  PainSc: 0-No pain                 Windell Norfolk

## 2023-06-16 ENCOUNTER — Ambulatory Visit (INDEPENDENT_AMBULATORY_CARE_PROVIDER_SITE_OTHER): Admitting: Nurse Practitioner

## 2023-06-16 VITALS — BP 103/68 | HR 87 | Temp 97.2°F | Ht 66.0 in | Wt 234.4 lb

## 2023-06-16 DIAGNOSIS — S30861A Insect bite (nonvenomous) of abdominal wall, initial encounter: Secondary | ICD-10-CM | POA: Diagnosis not present

## 2023-06-16 DIAGNOSIS — R6889 Other general symptoms and signs: Secondary | ICD-10-CM | POA: Diagnosis not present

## 2023-06-16 DIAGNOSIS — W57XXXA Bitten or stung by nonvenomous insect and other nonvenomous arthropods, initial encounter: Secondary | ICD-10-CM | POA: Diagnosis not present

## 2023-06-16 HISTORY — DX: Bitten or stung by nonvenomous insect and other nonvenomous arthropods, initial encounter: W57.XXXA

## 2023-06-16 NOTE — Progress Notes (Signed)
 Acute Office Visit  Subjective:     Patient ID: Janice Galvan, female    DOB: 15-Nov-1975, 48 y.o.   MRN: [id number]  Chief Complaint  Patient presents with   Rash    Has rash right leg that she noticed last Thursday unsure if tick bite     HPI Janice Galvan is a 48 year old female who presents on Jun 16, 2023, for an acute visit with concerns about a rash on her right leg, possibly related to a tick bite. She reports that she was dog-sitting at a friend's house approximately one week ago when she noticed a tick attached to her abdomen. About a week later, she developed a bruise-like rash on her right leg. She also experienced a persistent headache yesterday, which was unusual for her. She states her typical headaches are related to hunger or anxiety and usually resolve after eating or calming down; however, this one did not improve. She denies any systemic symptoms such as fever, chills, fatigue, or joint pain. Active Ambulatory Problems    Diagnosis Date Noted   Genital herpes simplex type 1 infection 05/29/2015   Thrombocytosis 05/30/2015   Microcytic anemia 10/22/2016   Sciatica of left side 05/26/2017   Submucous and subserous leiomyoma of uterus 01/03/2017   Refusal of blood product 01/03/2017   Menorrhagia with regular cycle 01/03/2017   Iron deficiency anemia due to chronic blood loss 01/03/2017   Morbid obesity (HCC) 11/23/2018   Tick bite of abdominal wall 06/16/2023   Resolved Ambulatory Problems    Diagnosis Date Noted   Ankle swelling 05/29/2015   Uterine leiomyoma 05/26/2017   Past Medical History:  Diagnosis Date   Anemia    HSV-1 (herpes simplex virus 1) infection     Review of Systems  Constitutional:  Negative for chills and fever.  HENT:  Negative for sinus pain and sore throat.   Respiratory:  Negative for cough and shortness of breath.   Cardiovascular:  Negative for chest pain and leg swelling.  Gastrointestinal:  Negative for diarrhea,  nausea and vomiting.  Musculoskeletal:  Negative for joint pain and myalgias.  Skin:  Positive for itching and rash.       Right leg  Neurological:  Negative for dizziness and headaches.   Negative unless indicated in HPI    Objective:    BP 103/68   Pulse 87   Temp (!) 97.2 F (36.2 C) (Temporal)   Ht 5\' 6"  (1.676 m)   Wt 234 lb 6.4 oz (106.3 kg)   SpO2 98%   BMI 37.83 kg/m  BP Readings from Last 3 Encounters:  06/16/23 103/68  04/08/23 (!) 99/57  01/15/22 119/74   Wt Readings from Last 3 Encounters:  06/16/23 234 lb 6.4 oz (106.3 kg)  04/08/23 230 lb (104.3 kg)  01/15/22 214 lb 6.4 oz (97.3 kg)      Physical Exam Vitals and nursing note reviewed.  Constitutional:      General: She is not in acute distress. HENT:     Head: Normocephalic and atraumatic.     Nose: Nose normal.     Mouth/Throat:     Mouth: Mucous membranes are moist.  Eyes:     Extraocular Movements: Extraocular movements intact.     Conjunctiva/sclera: Conjunctivae normal.     Pupils: Pupils are equal, round, and reactive to light.  Cardiovascular:     Heart sounds: Normal heart sounds.  Pulmonary:     Breath sounds: Normal breath sounds.  Musculoskeletal:  General: Normal range of motion.     Right lower leg: No edema.     Left lower leg: No edema.  Skin:    General: Skin is warm and dry.     Findings: Rash present. Rash is purpuric.     Comments: Right leg  Neurological:     Mental Status: She is alert.  Psychiatric:        Mood and Affect: Mood normal.        Behavior: Behavior normal.        Thought Content: Thought content normal.        Judgment: Judgment normal.     No results found for any visits on 06/16/23.      Assessment & Plan:  Tick bite of abdominal wall, initial encounter -     Lyme Disease Serology w/Reflex -     Alpha-Gal Panel  [name] is a 48 year old African-American female seen today for tick bite, no acute distress Tick bite: Lyme serology and  alpha gal panel result pending Patient Education: Tick Bite Care: Keep the bite areas clean and dry. You may apply an antibiotic ointment (like Neosporin) to reduce the risk of local infection.  Watch for signs of localized infection such as increased redness, warmth, swelling, or pus.  Signs and Symptoms of Tickborne Illness: Monitor for fever, chills, muscle aches, joint pain, fatigue, headache, or new rash (particularly a bull's-eye or expanding red rash).  Be alert for neurological symptoms such as facial droop, dizziness, or numbness.  When to Seek Medical Attention: Seek care promptly if any of the above symptoms develop within the next 30 days.  If a rash develops or if flu-like symptoms appear within a few weeks of the tick bites, follow up immediately for further evaluation and possible lab testing or antibiotic treatment.  Preventive Measures: Avoid wooded or brushy areas with high grass and leaf litter.  Use EPA-registered insect repellents containing DEET or permethrin-treated clothing when outdoors.  Perform full-body tick checks after outdoor activities, especially in areas endemic to Lyme disease or other tickborne illnesses.  Prophylactic Antibiotics: Currently, no prophylactic antibiotics are indicated unless the tick was attached for >=36 hours and removed within the past 72 hours, and the local Lyme disease prevalence is high. This does not appear to be the case at this time based on history, but follow-up may be warranted if symptoms develop.   Return to clinic or contact your provider if any new symptoms occur.  Routine testing for Lyme or other tickborne illnesses is not recommended unless symptoms develop.  The above assessment and management plan was discussed with the patient. The patient verbalized understanding of and has agreed to the management plan. Patient is aware to call the clinic if they develop any new symptoms or if symptoms persist or worsen. Patient  is aware when to return to the clinic for a follow-up visit. Patient educated on when it is appropriate to go to the emergency department.   Return if symptoms worsen or fail to improve.  [name], DNP [facility] [address] [place], [place] [address] [phone]  Note: This document was prepared by [employer] voice dictation technology and any errors that results from this process are unintentional.

## 2023-06-18 ENCOUNTER — Ambulatory Visit: Payer: Self-pay | Admitting: Nurse Practitioner

## 2023-06-18 LAB — ALPHA-GAL PANEL
Allergen Lamb IgE: <0.1 kU/L
Beef IgE: <0.1 kU/L
IgE (Immunoglobulin E), Serum: 41 [IU]/mL (ref 6–495)
O215-IgE Alpha-Gal: <0.1 kU/L
Pork IgE: <0.1 kU/L

## 2023-06-18 LAB — LYME DISEASE SEROLOGY W/REFLEX: Lyme Total Antibody EIA: NEGATIVE

## 2023-07-15 ENCOUNTER — Other Ambulatory Visit (HOSPITAL_COMMUNITY): Payer: Self-pay | Admitting: Family Medicine

## 2023-07-15 DIAGNOSIS — Z1231 Encounter for screening mammogram for malignant neoplasm of breast: Secondary | ICD-10-CM

## 2023-08-25 ENCOUNTER — Encounter: Payer: Self-pay | Admitting: Family Medicine

## 2023-08-25 ENCOUNTER — Ambulatory Visit (INDEPENDENT_AMBULATORY_CARE_PROVIDER_SITE_OTHER): Payer: BC Managed Care – PPO | Admitting: Family Medicine

## 2023-08-25 VITALS — BP 111/72 | HR 82 | Temp 98.8°F | Ht 66.0 in | Wt 238.0 lb

## 2023-08-25 DIAGNOSIS — H43393 Other vitreous opacities, bilateral: Secondary | ICD-10-CM

## 2023-08-25 DIAGNOSIS — Z6838 Body mass index (BMI) 38.0-38.9, adult: Secondary | ICD-10-CM

## 2023-08-25 DIAGNOSIS — Z8349 Family history of other endocrine, nutritional and metabolic diseases: Secondary | ICD-10-CM | POA: Diagnosis not present

## 2023-08-25 DIAGNOSIS — E7800 Pure hypercholesterolemia, unspecified: Secondary | ICD-10-CM | POA: Diagnosis not present

## 2023-08-25 DIAGNOSIS — Z130 Encounter for screening for diseases of the blood and blood-forming organs and certain disorders involving the immune mechanism: Secondary | ICD-10-CM | POA: Diagnosis not present

## 2023-08-25 DIAGNOSIS — E66811 Obesity, class 1: Secondary | ICD-10-CM | POA: Diagnosis not present

## 2023-08-25 DIAGNOSIS — N951 Menopausal and female climacteric states: Secondary | ICD-10-CM

## 2023-08-25 DIAGNOSIS — Z0001 Encounter for general adult medical examination with abnormal findings: Secondary | ICD-10-CM | POA: Diagnosis not present

## 2023-08-25 DIAGNOSIS — Z0000 Encounter for general adult medical examination without abnormal findings: Secondary | ICD-10-CM

## 2023-08-25 LAB — LIPID PANEL

## 2023-08-25 LAB — BAYER DCA HB A1C WAIVED: HB A1C (BAYER DCA - WAIVED): 4.6 % — ABNORMAL LOW (ref 4.8–5.6)

## 2023-08-25 NOTE — Patient Instructions (Signed)
 Perimenopause: What to Know Perimenopause is the time in your life when your levels of estrogen start to go down. Estrogen is the female hormone made by your ovaries. Perimenopause can start 2-8 years before menopause. It can cause changes to your menstrual period. During this time, your ovaries may or may not make an egg. In many cases, you can still get pregnant. What are the causes? Perimenopause is a natural change in your homone levels that happens as you get older. What increases the risk? You're more likely to start perimenopause Janice Galvan if: You have an abnormal growth (tumor) of the pituitary gland in your brain. You have a disease that affects your ovaries. You've had certain treatments for cancer. These include: Chemotherapy. Hormone therapy. Radiation therapy on the area between your hips (pelvis). You smoke a lot or drink a lot of alcohol. Other family members have gone through menopause Janice Galvan. What are the signs or symptoms? Symptoms are unique to each person. You may have: Hot flashes. Irregular periods. Night sweats. Changes in how you feel about sex. You may have less of a sex drive or feel more discomfort around your sexuality. Vaginal dryness. Headaches. Mood swings. Other symptoms may include: Depression. This is when you feel sad or hopeless. Trouble sleeping. Memory problems or trouble focusing. Irritability. This means getting annoyed easily. Tiredness. Weight gain. Anxiety. This is feeling worried or nervous. You can also have trouble getting pregnant. How is this diagnosed? You may be diagnosed based on: Your medical history. An exam. Your age. Your history of menstrual periods. Your symptoms. Hormone tests. How is this treated? In some cases, no treatment is needed. Talk with your health care provider about if you should get treated. Treatments may include: Menopausal hormone therapy (MHT). Medicines to treat certain  symptoms. Acupuncture. Vitamin or herbal supplements. Before you start treatment, let your provider know if you or anyone in your family has or has had: Heart disease. Breast cancer. Blood clots. Diabetes. Osteoporosis. Follow these instructions at home: Eating and drinking  Eat a balanced diet. It should include: Fresh fruits and vegetables. Whole grains. Soybeans. Eggs. Lean meat. Low-fat dairy. To help prevent hot flashes, stay away from: Alcohol. Drinks with caffeine in them. Spicy foods. Lifestyle Do not smoke, vape, or use nicotine or tobacco. Get at least 30 minutes of physical activity on 5 or more days each week. Get 7-8 hours of sleep each night. Dress in layers that can be taken off if you have a hot flash. Find ways to manage stress. You may want to try: Deep breathing. Meditation. Writing in a journal. General instructions  Take your medicines only as told. Keep track of your periods. Track: When they happen. How heavy they are. How long they last. How much time passes between periods. Keep track of your symptoms. Track: When they start. How often you have them. How long they last. Use vaginal lubricants or moisturizers. These can help with: Vaginal dryness. Comfort during sex. You can still get pregnant if you're having any periods. Make sure you use birth control if you don't want to get pregnant. Contact a health care provider if: You have a very heavy period or pass blood clots. Your period lasts more than 2 days longer than normal. Your period comes back sooner than 21 days. You bleed after having sex. You have pain during sex. You have pain when you pee. You get very bad headaches. You have trouble with your eyesight. Get help right away if: You  have chest pain. You have trouble breathing. You have trouble talking. You have very bad depression. This information is not intended to replace advice given to you by your health care provider.  Make sure you discuss any questions you have with your health care provider. Document Revised: 09/19/2022 Document Reviewed: 09/19/2022 Elsevier Patient Education  2024 [employer].

## 2023-08-25 NOTE — Progress Notes (Signed)
 Janice Galvan is a 48 y.o. female presents to office today for annual physical exam examination.    Concerns today include: 1.  Hot flashes Continue does not have hot flashes intermittently.  She does not feel it gets particularly interfering with her normal day but wanted to make note of it.  She continues to have irregular menstrual cycles with last menstrual cycle occurring in April.  It tends to skip months here and there.  She does note that her mother was diagnosed with Graves' disease last year  2.  Visual floaters This has been something has been increasing and she was sent by her optometrist to an ophthalmologist for further evaluation.  They observed her closely for about a year and have now released her to once yearly checkups since everything has been stable.  She reports no concerning features or signs suggestive of retinal detachment  Occupation: Works from home,  Substance use: None There are no preventive care reminders to display for this patient.  Refills needed today: N/A  Immunization History  Administered Date(s) Administered   Moderna Sars-Covid-2 Vaccination 05/13/2019, 06/10/2019, 02/07/2020   Tdap 10/27/2017   Past Medical History:  Diagnosis Date   Anemia    HSV-1 (herpes simplex virus 1) infection    Tick bite of abdominal wall 06/16/2023   Social History   Socioeconomic History   Marital status: Single    Spouse name: Not on file   Number of children: Not on file   Years of education: Not on file   Highest education level: Some college, no degree  Occupational History   Not on file  Tobacco Use   Smoking status: Never   Smokeless tobacco: Never  Vaping Use   Vaping status: Never Used  Substance and Sexual Activity   Alcohol use: Yes    Comment: occ   Drug use: No   Sexual activity: Not Currently    Birth control/protection: None  Other Topics Concern   Not on file  Social History Narrative   Not on file   Social Drivers of Health    Financial Resource Strain: Low Risk  (08/21/2023)   Overall Financial Resource Strain (CARDIA)    Difficulty of Paying Living Expenses: Not very hard  Food Insecurity: No Food Insecurity (08/21/2023)   Hunger Vital Sign    Worried About Running Out of Food in the Last Year: Never true    Ran Out of Food in the Last Year: Never true  Transportation Needs: No Transportation Needs (08/21/2023)   PRAPARE - Administrator, Civil Service (Medical): No    Lack of Transportation (Non-Medical): No  Physical Activity: Inactive (08/21/2023)   Exercise Vital Sign    Days of Exercise per Week: 0 days    Minutes of Exercise per Session: Not on file  Stress: No Stress Concern Present (08/21/2023)   [employer] of Occupational Health - Occupational Stress Questionnaire    Feeling of Stress: Only a little  Social Connections: Socially Isolated (08/21/2023)   Social Connection and Isolation Panel    Frequency of Communication with Friends and Family: Once a week    Frequency of Social Gatherings with Friends and Family: Once a week    Attends Religious Services: More than 4 times per year    Active Member of [employer] or Organizations: No    Attends Engineer, structural: Not on file    Marital Status: Never married  Intimate Partner Violence: Not on file  Past Surgical History:  Procedure Laterality Date   ANTERIOR CRUCIATE LIGAMENT REPAIR Left 2001   BIOPSY  01/26/2018   Procedure: BIOPSY;  Surgeon: Harvey Margo CROME, MD;  Location: [facility];  Service: Endoscopy;;  gastric and duodenuem   COLONOSCOPY N/A 01/26/2018   two 3-4 mm polyps in sigmoid colon (hyperplastic), internal hemorrhoids. Next age 20-50   COLONOSCOPY WITH PROPOFOL  N/A 04/08/2023   Procedure: COLONOSCOPY WITH PROPOFOL ;  Surgeon: Cindie Carlin POUR, DO;  Location: [facility];  Service: Endoscopy;  Laterality: N/A;  900am, asa 2   ENDOMETRIAL ABLATION  02/2017   [place]   ESOPHAGOGASTRODUODENOSCOPY N/A  01/26/2018   EGD with few gastric polyps, fundic gland. Gastritis, normal small bowel biopsies   POLYPECTOMY  01/26/2018   Procedure: POLYPECTOMY;  Surgeon: Harvey Margo CROME, MD;  Location: [facility];  Service: Endoscopy;;  sigmoid   POLYPECTOMY  04/08/2023   Procedure: POLYPECTOMY;  Surgeon: Cindie Carlin POUR, DO;  Location: [facility];  Service: Endoscopy;;   Family History  Problem Relation Age of Onset   Graves' disease Mother    Cancer Father        lung   Schizophrenia Brother    Diabetes Brother    Hypertension Brother    Diabetes Maternal Uncle    Graves' disease Maternal Uncle    Lymphoma Maternal Uncle    Cancer Maternal Grandmother    Colon cancer Neg Hx    Colon polyps Neg Hx    No current outpatient medications on file.  No Known Allergies   ROS: Review of Systems Pertinent items noted in HPI and remainder of comprehensive ROS otherwise negative.    Physical exam BP 111/72   Pulse 82   Temp 98.8 F (37.1 C)   Ht 5' 6 (1.676 m)   Wt 238 lb (108 kg)   LMP 05/07/2023   SpO2 98%   BMI 38.41 kg/m  General appearance: alert, cooperative, appears stated age, no distress, and morbidly obese Head: Normocephalic, without obvious abnormality, atraumatic Eyes: negative findings: lids and lashes normal, conjunctivae and sclerae normal, corneas clear, and pupils equal, round, reactive to light and accomodation Ears: normal TM's and external ear canals both ears Nose: no discharge Throat: lips, mucosa, and tongue normal; teeth and gums normal Neck: no adenopathy, no carotid bruit, supple, symmetrical, trachea midline, and thyroid  not enlarged, symmetric, no tenderness/mass/nodules Back: symmetric, no curvature. ROM normal. No CVA tenderness. Lungs: clear to auscultation bilaterally Heart: regular rate and rhythm, S1, S2 normal, no murmur, click, rub or gallop Abdomen: soft, non-tender; bowel sounds normal; no masses,  no organomegaly Extremities: extremities  normal, atraumatic, no cyanosis or edema Pulses: 2+ and symmetric Skin: Skin color, texture, turgor normal. No rashes or lesions Lymph nodes: Cervical, supraclavicular, and axillary nodes normal. Neurologic: Grossly normal      08/25/2023   10:32 AM 01/15/2022    3:50 PM 01/04/2021    2:06 PM  Depression screen PHQ 2/9  Decreased Interest 0 0 0  Down, Depressed, Hopeless 0 0 1  PHQ - 2 Score 0 0 1  Altered sleeping 0  0  Tired, decreased energy 0  0  Change in appetite 0  0  Feeling bad or failure about yourself  0  1  Trouble concentrating 0  0  Moving slowly or fidgety/restless 0  0  Suicidal thoughts 0  0  PHQ-9 Score 0  2  Difficult doing work/chores Not difficult at all  Not difficult  at all      08/25/2023   10:32 AM 01/15/2022    3:50 PM 01/04/2021    2:06 PM  GAD 7 : Generalized Anxiety Score  Nervous, Anxious, on Edge 0 0 0  Control/stop worrying 0 0 0  Worry too much - different things 0 0 0  Trouble relaxing 0 0 0  Restless 0 0 0  Easily annoyed or irritable 0 0 0  Afraid - awful might happen 0 0 0  Total GAD 7 Score 0 0 0  Anxiety Difficulty Not difficult at all Not difficult at all Not difficult at all     Assessment/ Plan: Myrick Lent here for annual physical exam.   Annual physical exam  Pure hypercholesterolemia - Plan: CMP14+EGFR, Lipid Panel, CANCELED: TSH  Obesity (BMI 30.0-34.9) - Plan: CMP14+EGFR, Bayer DCA Hb A1c Waived, VITAMIN D  25 Hydroxy (Vit-D Deficiency, Fractures), CANCELED: TSH  Screening, anemia, deficiency, iron - Plan: CBC  Family history of Graves' disease - Plan: TSH + free T4, Thyrotropin receptor autoabs  Perimenopausal  Floaters in visual field, bilateral  Doing well and up-to-date on preventative health care.  Fasting labs collected today.  I ordered thyroid  labs given family history of Graves' disease newly diagnosed in mother last year.  She is not exhibiting any concerning symptoms currently however.  A1c did  not demonstrate prediabetes or diabetes.  Will check vitamin D  level given BMI.  Screening CBC collected  I suspect that she is perimenopausal and handout was provided on this.  Continues to follow with retinal specialist for floaters in visual field yearly.  She will follow-up if she has any concerning symptoms or signs of retinal detachment  Counseled on healthy lifestyle choices, including diet (rich in fruits, vegetables and lean meats and low in salt and simple carbohydrates) and exercise (at least 30 minutes of moderate physical activity daily).  Patient to follow up 1 year, sooner if concerns arise  Girlie Veltri M. Jolinda, DO

## 2023-08-26 ENCOUNTER — Ambulatory Visit: Payer: Self-pay | Admitting: Family Medicine

## 2023-08-26 ENCOUNTER — Other Ambulatory Visit: Payer: Self-pay | Admitting: Family Medicine

## 2023-08-26 DIAGNOSIS — E559 Vitamin D deficiency, unspecified: Secondary | ICD-10-CM

## 2023-08-26 DIAGNOSIS — E7800 Pure hypercholesterolemia, unspecified: Secondary | ICD-10-CM

## 2023-08-26 DIAGNOSIS — E66811 Obesity, class 1: Secondary | ICD-10-CM

## 2023-08-26 LAB — CBC
Hematocrit: 40.8 % (ref 34.0–46.6)
Hemoglobin: 13.2 g/dL (ref 11.1–15.9)
MCH: 29.1 pg (ref 26.6–33.0)
MCHC: 32.4 g/dL (ref 31.5–35.7)
MCV: 90 fL (ref 79–97)
Platelets: 434 x10E3/uL (ref 150–450)
RBC: 4.54 x10E6/uL (ref 3.77–5.28)
RDW: 11.8 % (ref 11.7–15.4)
WBC: 4.9 x10E3/uL (ref 3.4–10.8)

## 2023-08-26 LAB — CMP14+EGFR
ALT: 30 IU/L (ref 0–32)
AST: 24 IU/L (ref 0–40)
Albumin: 4.6 g/dL (ref 3.9–4.9)
Alkaline Phosphatase: 110 IU/L (ref 44–121)
BUN/Creatinine Ratio: 14 (ref 9–23)
BUN: 12 mg/dL (ref 6–24)
Bilirubin Total: 0.6 mg/dL (ref 0.0–1.2)
CO2: 21 mmol/L (ref 20–29)
Calcium: 10 mg/dL (ref 8.7–10.2)
Chloride: 100 mmol/L (ref 96–106)
Creatinine, Ser: 0.86 mg/dL (ref 0.57–1.00)
Globulin, Total: 3.3 g/dL (ref 1.5–4.5)
Glucose: 84 mg/dL (ref 70–99)
Potassium: 4.6 mmol/L (ref 3.5–5.2)
Sodium: 138 mmol/L (ref 134–144)
Total Protein: 7.9 g/dL (ref 6.0–8.5)
eGFR: 85 mL/min/1.73 (ref >59)

## 2023-08-26 LAB — TSH+FREE T4
Free T4: 1.02 ng/dL (ref 0.82–1.77)
TSH: 2.44 u[IU]/mL (ref 0.450–4.500)

## 2023-08-26 LAB — LIPID PANEL
Cholesterol, Total: 178 mg/dL (ref 100–199)
HDL: 56 mg/dL (ref >39)
LDL CALC COMMENT:: 3.2 ratio (ref 0.0–4.4)
LDL Chol Calc (NIH): 103 mg/dL — AB (ref 0–99)
Triglycerides: 108 mg/dL (ref 0–149)
VLDL Cholesterol Cal: 19 mg/dL (ref 5–40)

## 2023-08-26 LAB — THYROTROPIN RECEPTOR AUTOABS: Thyrotropin Receptor Ab: <1.1 IU/L (ref 0.00–1.75)

## 2023-08-26 LAB — VITAMIN D 25 HYDROXY (VIT D DEFICIENCY, FRACTURES): Vit D, 25-Hydroxy: 19.2 ng/mL — AB (ref 30.0–100.0)

## 2023-08-26 MED ORDER — VITAMIN D (ERGOCALCIFEROL) 1.25 MG (50000 UNIT) PO CAPS: 50000.0000 [IU] | ORAL_CAPSULE | ORAL | 3 refills | Status: AC

## 2023-08-26 NOTE — Progress Notes (Signed)
 Order in  Orders Placed This Encounter  Procedures   CT CARDIAC SCORING (SELF PAY ONLY)    Standing Status:   Future    Expiration Date:   08/25/2024    Is patient pregnant?:   No    Preferred imaging location?:   [facility]

## 2023-09-01 ENCOUNTER — Other Ambulatory Visit: Payer: Self-pay | Admitting: Medical Genetics

## 2023-09-01 ENCOUNTER — Ambulatory Visit (HOSPITAL_COMMUNITY)
Admission: RE | Admit: 2023-09-01 | Discharge: 2023-09-01 | Disposition: A | Discharge status: Home / Self Care | Payer: Self-pay | Source: Ambulatory Visit | Attending: Family Medicine | Admitting: Family Medicine

## 2023-09-01 DIAGNOSIS — E66811 Obesity, class 1: Secondary | ICD-10-CM | POA: Insufficient documentation

## 2023-09-01 DIAGNOSIS — E7800 Pure hypercholesterolemia, unspecified: Secondary | ICD-10-CM | POA: Insufficient documentation

## 2023-09-03 ENCOUNTER — Ambulatory Visit: Payer: Self-pay | Admitting: Family Medicine

## 2023-09-08 ENCOUNTER — Other Ambulatory Visit (HOSPITAL_COMMUNITY)

## 2023-11-20 ENCOUNTER — Other Ambulatory Visit: Payer: Self-pay | Admitting: Medical Genetics

## 2023-11-20 DIAGNOSIS — Z006 Encounter for examination for normal comparison and control in clinical research program: Secondary | ICD-10-CM

## 2023-12-08 ENCOUNTER — Ambulatory Visit (HOSPITAL_COMMUNITY)
Admission: RE | Admit: 2023-12-08 | Discharge: 2023-12-08 | Disposition: A | Discharge status: Home / Self Care | Source: Ambulatory Visit | Attending: Family Medicine | Admitting: Family Medicine

## 2023-12-08 DIAGNOSIS — Z1231 Encounter for screening mammogram for malignant neoplasm of breast: Secondary | ICD-10-CM | POA: Insufficient documentation

## 2023-12-22 LAB — GENECONNECT MOLECULAR SCREEN: Genetic Analysis Overall Interpretation: NEGATIVE

## 2024-08-30 ENCOUNTER — Encounter: Payer: Self-pay | Admitting: Family Medicine
